# Patient Record
Sex: Male | Born: 1960 | Race: Asian | Hispanic: No | Marital: Married | Smoking: Never smoker
Health system: Southern US, Community
[De-identification: ages and names within clinical notes are randomized; demographics above are authoritative.]

## PROBLEM LIST (undated history)

## (undated) DIAGNOSIS — E785 Hyperlipidemia, unspecified: Secondary | ICD-10-CM

## (undated) DIAGNOSIS — K279 Peptic ulcer, site unspecified, unspecified as acute or chronic, without hemorrhage or perforation: Secondary | ICD-10-CM

## (undated) DIAGNOSIS — K219 Gastro-esophageal reflux disease without esophagitis: Secondary | ICD-10-CM

## (undated) HISTORY — DX: Hyperlipidemia, unspecified: E78.5

## (undated) HISTORY — DX: Peptic ulcer, site unspecified, unspecified as acute or chronic, without hemorrhage or perforation: K27.9

## (undated) HISTORY — PX: ESOPHAGOGASTRODUODENOSCOPY: SHX1529

## (undated) HISTORY — DX: Gastro-esophageal reflux disease without esophagitis: K21.9

---

## 2010-03-24 HISTORY — PX: HEMORRHOID SURGERY: SHX153

## 2010-08-31 ENCOUNTER — Observation Stay (HOSPITAL_COMMUNITY): Admission: AD | Admit: 2010-08-31 | Discharge: 2010-04-05 | Payer: Self-pay | Admitting: General Surgery

## 2010-12-10 LAB — BASIC METABOLIC PANEL
BUN: 16 mg/dL (ref 6–23)
Chloride: 106 mEq/L (ref 96–112)
Creatinine, Ser: 0.76 mg/dL (ref 0.4–1.5)
GFR calc Af Amer: >60 mL/min (ref >60)
Glucose, Bld: 95 mg/dL (ref 70–99)
Potassium: 4 mEq/L (ref 3.5–5.1)
Sodium: 141 mEq/L (ref 135–145)

## 2010-12-10 LAB — CBC
MCH: 29.5 pg (ref 26.0–34.0)
MCHC: 34 g/dL (ref 30.0–36.0)
RBC: 5.05 MIL/uL (ref 4.22–5.81)

## 2012-01-28 IMAGING — CR DG CHEST 2V
2 series · 2 of 2 positions shown · non-contrast
Comparison: None.

CLINICAL DATA: 49-year-old male preoperative study.

CHEST - 2 VIEW

[w chest pa]
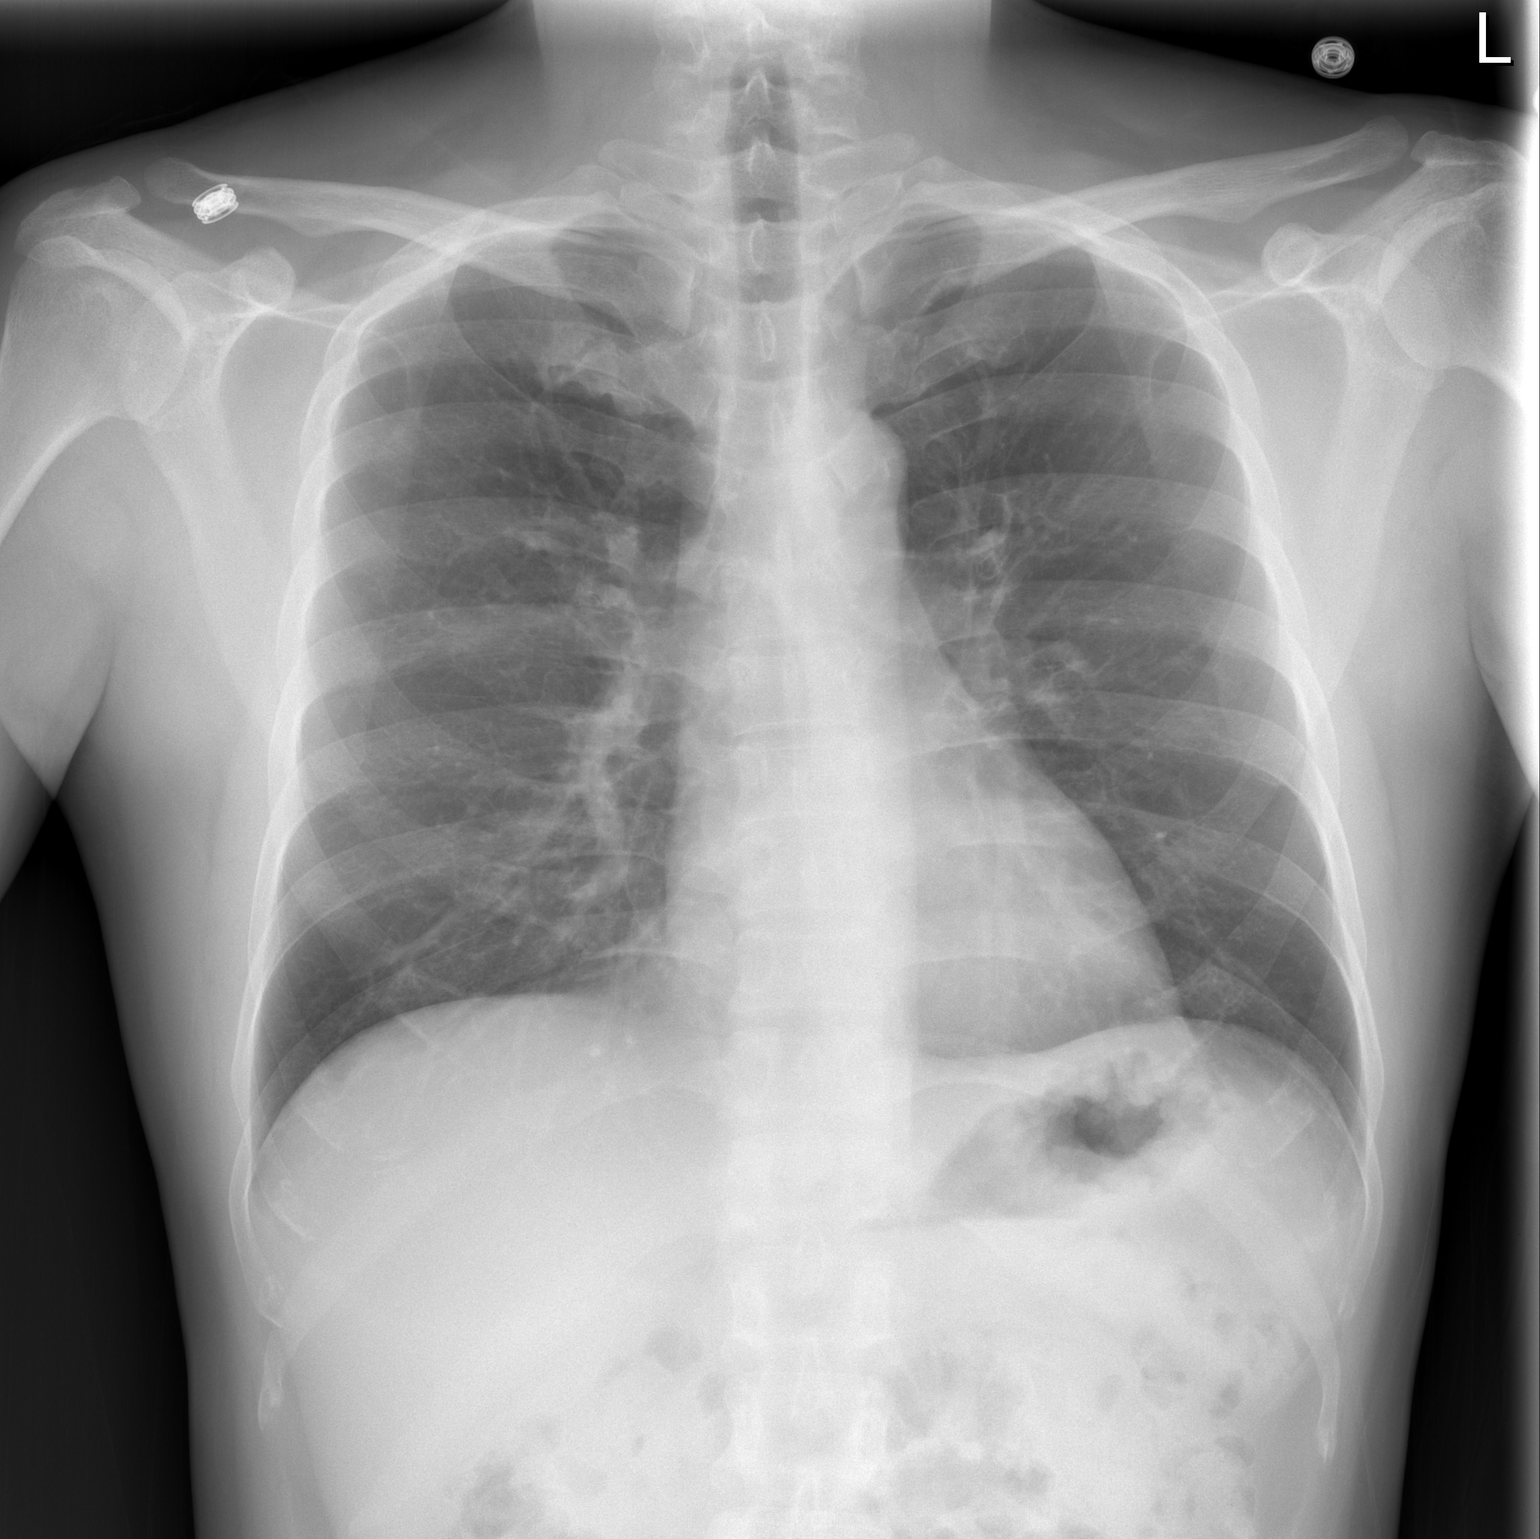

[w chest lat]
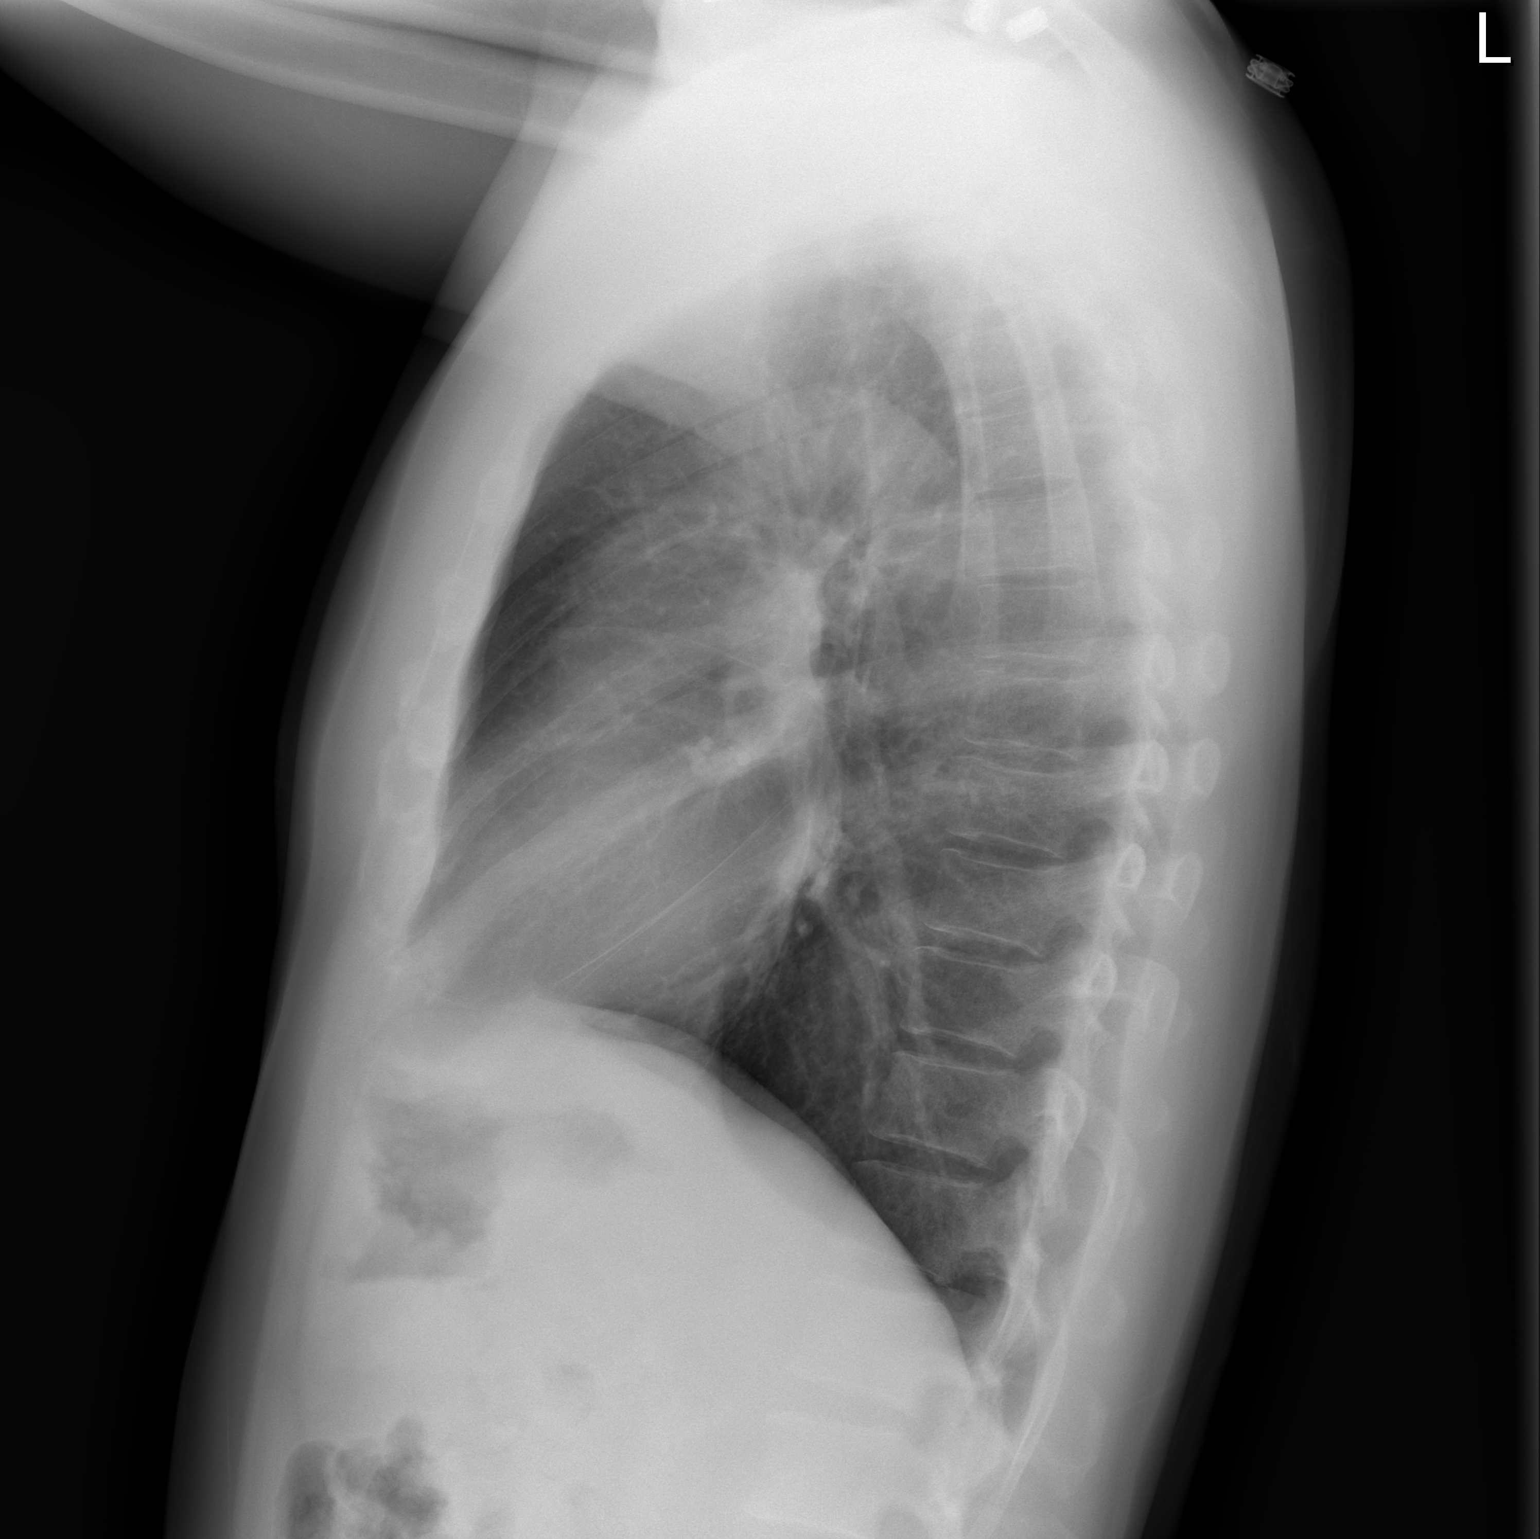

[2 of 2 positions shown; findings below may reference images not displayed]

FINDINGS: Normal lung volumes. Normal cardiac size and mediastinal
contours.  Visualized tracheal air column is within normal limits.
The lungs are clear. No acute osseous abnormality identified.
IMPRESSION: Negative, no acute cardiopulmonary abnormality.

## 2015-09-05 ENCOUNTER — Encounter: Payer: Self-pay | Admitting: Family Medicine

## 2015-09-05 ENCOUNTER — Ambulatory Visit (INDEPENDENT_AMBULATORY_CARE_PROVIDER_SITE_OTHER): Payer: BC Managed Care – PPO | Admitting: Family Medicine

## 2015-09-05 VITALS — BP 128/74 | Temp 97.8°F | Ht 68.25 in | Wt 149.0 lb

## 2015-09-05 DIAGNOSIS — Z0000 Encounter for general adult medical examination without abnormal findings: Secondary | ICD-10-CM

## 2015-09-05 DIAGNOSIS — Z125 Encounter for screening for malignant neoplasm of prostate: Secondary | ICD-10-CM

## 2015-09-05 NOTE — Progress Notes (Signed)
Office Note 09/05/2015  CC: No chief complaint on file.   HPI:  Johnny Fields is a 54 y.o. Asian male who is here to establish care.   Patient's most recent primary MD: Aleatha Borer, [facility] Old records in EPIC/HL EMR were reviewed prior to or during today's visit. Mild language barrier today.  Gets regular dental care.  No acute complaints today. Sounds like hx of hyperlipidemia but no meds recommended?--pt not clear, no old records.  Past Medical History  Diagnosis Date  . GERD (gastroesophageal reflux disease)   . PUD (peptic ulcer disease)   . Hyperlipidemia     Past Surgical History  Procedure Laterality Date  . Hemorrhoid surgery  03/2010  . Esophagogastroduodenoscopy  2000/2001    PUD/upper GI bleed.  Sounds like he was treated for H pylori at least once    No family history on file.  Social History   Social History  . Marital Status: Married    Spouse Name: N/A  . Number of Children: N/A  . Years of Education: N/A   Occupational History  . Not on file.   Social History Main Topics  . Smoking status: Never Smoker   . Smokeless tobacco: Not on file  . Alcohol Use: Not on file  . Drug Use: Not on file  . Sexual Activity: Not on file   Other Topics Concern  . Not on file   Social History Narrative   Married, one son and one daughter.   Orig from [place], has been in [place] almost 20 yrs.   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   No tobacco.  Occ alcohol.  No drugs.    Outpatient Encounter Prescriptions as of 09/05/2015  Medication Sig  . omeprazole (PRILOSEC) 20 MG capsule Take 20 mg by mouth.   No facility-administered encounter medications on file as of 09/05/2015.    No Known Allergies  ROS Review of Systems  Constitutional: Negative for fever, chills, appetite change and fatigue.  HENT: Negative for congestion, dental problem, ear pain and sore throat.   Eyes: Negative for discharge, redness  and visual disturbance.  Respiratory: Negative for cough, chest tightness, shortness of breath and wheezing.   Cardiovascular: Negative for chest pain, palpitations and leg swelling.  Gastrointestinal: Negative for nausea, vomiting, abdominal pain, diarrhea and blood in stool.  Genitourinary: Negative for dysuria, urgency, frequency, hematuria, flank pain and difficulty urinating.  Musculoskeletal: Negative for myalgias, back pain, joint swelling, arthralgias and neck stiffness.  Skin: Negative for pallor and rash.  Neurological: Negative for dizziness, speech difficulty, weakness and headaches.  Hematological: Negative for adenopathy. Does not bruise/bleed easily.  Psychiatric/Behavioral: Negative for confusion and sleep disturbance. The patient is not nervous/anxious.     PE; Blood pressure 128/74, temperature 97.8 F (36.6 C), temperature source Oral, height 5' 8.25" (1.734 m), weight 149 lb (67.586 kg). Gen: Alert, well appearing.  Patient is oriented to person, place, time, and situation. AFFECT: pleasant, lucid thought and speech. ENT: Ears: EACs clear, normal epithelium.  TMs with good light reflex and landmarks bilaterally.  Eyes: no injection, icteris, swelling, or exudate.  EOMI, PERRLA. Nose: no drainage or turbinate edema/swelling.  No injection or focal lesion.  Mouth: lips without lesion/swelling.  Oral mucosa pink and moist.  Dentition intact and without obvious caries or gingival swelling.  Oropharynx without erythema, exudate, or swelling.  Neck: supple/nontender.  No LAD, mass, or TM.  Carotid pulses 2+ bilaterally, without  bruits. CV: RRR, no m/r/g.   LUNGS: CTA bilat, nonlabored resps, good aeration in all lung fields. ABD: soft, NT, ND, BS normal.  No hepatospenomegaly or mass.  No bruits. EXT: no clubbing, cyanosis, or edema.  Musculoskeletal: no joint swelling, erythema, warmth, or tenderness.  ROM of all joints intact. Skin - no sores or suspicious lesions or rashes  or color changes Rectal exam: negative without mass, lesions or tenderness, PROSTATE EXAM: smooth and symmetric without nodules or tenderness.   Pertinent labs:  None today  ASSESSMENT AND PLAN:   New pt; obtain prior PCP records.  Health maintenance exam: Reviewed age and gender appropriate health maintenance issues (prudent diet, regular exercise, health risks of tobacco and excessive alcohol, use of seatbelts, fire alarms in home, use of sunscreen).  Also reviewed age and gender appropriate health screening as well as vaccine recommendations. Pt declined flu vaccine and colon cancer screening today. DRE normal.  PSA ordered. HP ordered and he'll return when fasting to get this.  An After Visit Summary was printed and given to the patient.   Return in about 1 year (around 09/04/2016) for annual CPE (fasting); also needs fasting lab appt at his earliest convenience.

## 2015-09-06 ENCOUNTER — Other Ambulatory Visit (INDEPENDENT_AMBULATORY_CARE_PROVIDER_SITE_OTHER): Payer: BC Managed Care – PPO

## 2015-09-06 DIAGNOSIS — Z0000 Encounter for general adult medical examination without abnormal findings: Secondary | ICD-10-CM | POA: Diagnosis not present

## 2015-09-06 DIAGNOSIS — R7989 Other specified abnormal findings of blood chemistry: Secondary | ICD-10-CM | POA: Diagnosis not present

## 2015-09-06 DIAGNOSIS — Z125 Encounter for screening for malignant neoplasm of prostate: Secondary | ICD-10-CM | POA: Diagnosis not present

## 2015-09-06 LAB — CBC WITH DIFFERENTIAL/PLATELET
BASOS ABS: 0 10*3/uL (ref 0.0–0.1)
BASOS PCT: 0.7 % (ref 0.0–3.0)
EOS ABS: 0.2 10*3/uL (ref 0.0–0.7)
EOS PCT: 4.4 % (ref 0.0–5.0)
HCT: 45.8 % (ref 39.0–52.0)
HEMOGLOBIN: 15 g/dL (ref 13.0–17.0)
LYMPHS PCT: 33.4 % (ref 12.0–46.0)
Lymphs Abs: 1.2 10*3/uL (ref 0.7–4.0)
MCHC: 32.8 g/dL (ref 30.0–36.0)
MCV: 88.2 fl (ref 78.0–100.0)
MONO ABS: 0.2 10*3/uL (ref 0.1–1.0)
Monocytes Relative: 5.3 % (ref 3.0–12.0)
NEUTROS ABS: 2 10*3/uL (ref 1.4–7.7)
Neutrophils Relative %: 56.2 % (ref 43.0–77.0)
Platelets: 241 10*3/uL (ref 150.0–400.0)
RBC: 5.19 Mil/uL (ref 4.22–5.81)
RDW: 13.1 % (ref 11.5–15.5)
WBC: 3.6 10*3/uL — AB (ref 4.0–10.5)

## 2015-09-06 LAB — LIPID PANEL
CHOLESTEROL: 302 mg/dL — AB (ref 0–200)
HDL: 50.7 mg/dL (ref >39.00)
NonHDL: 251.2
TRIGLYCERIDES: 216 mg/dL — AB (ref 0.0–149.0)
Total CHOL/HDL Ratio: 6
VLDL: 43.2 mg/dL — AB (ref 0.0–40.0)

## 2015-09-06 LAB — COMPREHENSIVE METABOLIC PANEL
ALBUMIN: 4.2 g/dL (ref 3.5–5.2)
ALT: 16 U/L (ref 0–53)
AST: 14 U/L (ref 0–37)
Alkaline Phosphatase: 39 U/L (ref 39–117)
BUN: 15 mg/dL (ref 6–23)
CALCIUM: 9.3 mg/dL (ref 8.4–10.5)
CHLORIDE: 102 meq/L (ref 96–112)
CO2: 31 mEq/L (ref 19–32)
CREATININE: 0.89 mg/dL (ref 0.40–1.50)
GFR: 94.49 mL/min (ref >60.00)
Glucose, Bld: 95 mg/dL (ref 70–99)
Potassium: 4.1 mEq/L (ref 3.5–5.1)
Sodium: 140 mEq/L (ref 135–145)
Total Bilirubin: 0.6 mg/dL (ref 0.2–1.2)
Total Protein: 6.6 g/dL (ref 6.0–8.3)

## 2015-09-06 LAB — PSA: PSA: 2.19 ng/mL (ref 0.10–4.00)

## 2015-09-06 LAB — LDL CHOLESTEROL, DIRECT: LDL DIRECT: 223 mg/dL

## 2015-09-06 LAB — TSH: TSH: 1.16 u[IU]/mL (ref 0.35–4.50)

## 2015-09-07 ENCOUNTER — Encounter: Payer: Self-pay | Admitting: *Deleted

## 2015-10-17 ENCOUNTER — Encounter: Payer: Self-pay | Admitting: Family Medicine

## 2015-10-17 DIAGNOSIS — Z1211 Encounter for screening for malignant neoplasm of colon: Secondary | ICD-10-CM | POA: Insufficient documentation

## 2016-05-24 ENCOUNTER — Ambulatory Visit (INDEPENDENT_AMBULATORY_CARE_PROVIDER_SITE_OTHER): Payer: BC Managed Care – PPO | Admitting: Family Medicine

## 2016-05-24 ENCOUNTER — Encounter: Payer: Self-pay | Admitting: Family Medicine

## 2016-05-24 VITALS — BP 135/82 | HR 69 | Temp 98.6°F | Resp 16 | Ht 68.0 in | Wt 156.0 lb

## 2016-05-24 DIAGNOSIS — K219 Gastro-esophageal reflux disease without esophagitis: Secondary | ICD-10-CM | POA: Insufficient documentation

## 2016-05-24 DIAGNOSIS — B029 Zoster without complications: Secondary | ICD-10-CM

## 2016-05-24 MED ORDER — VALACYCLOVIR HCL 1 G PO TABS: 1000.0000 mg | ORAL_TABLET | Freq: Three times a day (TID) | ORAL | 0 refills | Status: DC

## 2016-05-24 MED ORDER — PREDNISONE 20 MG PO TABS: ORAL_TABLET | ORAL | 0 refills | Status: DC

## 2016-05-24 NOTE — Progress Notes (Signed)
OFFICE VISIT  05/24/2016   CC:  Chief Complaint  Patient presents with  . Rash    left side body   HPI:    Patient is a 55 y.o.  male who presents for rash. Onset about 5 days ago mildly itchy/prickly rash on left side of trunk.  Spreading gradually, little blisters-pink. No fever or malaise.  Has been applying hydrocortisone: no help.   Past Medical History:  Diagnosis Date  . GERD (gastroesophageal reflux disease)   . Hyperlipidemia   . PUD (peptic ulcer disease)     Past Surgical History:  Procedure Laterality Date  . ESOPHAGOGASTRODUODENOSCOPY  2000/2001   PUD/upper GI bleed.  Sounds like he was treated for H pylori at least once  . HEMORRHOID SURGERY  03/2010    Outpatient Medications Prior to Visit  Medication Sig Dispense Refill  . omeprazole (PRILOSEC) 20 MG capsule Take 20 mg by mouth.     No facility-administered medications prior to visit.     No Known Allergies  ROS As per HPI  PE: Blood pressure 135/82, pulse 69, temperature 98.6 F (37 C), temperature source Oral, resp. rate 16, height 5\' 8"  (1.727 m), weight 156 lb (70.8 kg), SpO2 97 %. Gen: Alert, well appearing.  Patient is oriented to person, place, time, and situation. Left side of trunk at the level of lower costal margin he has vesicular rash on pinkish background spread in a dermatomal distribution.  LABS:  none  IMPRESSION AND PLAN:  Herpes Zoster: discussed dx with pt. Valtrex 1g tid x 7d. Prednisone 40mg  qd x 5d, then 20mg  qd x 5d. He is not in much pain at all so nothing was rx'd for this today. Recommended avoiding topical meds. Discussed care of rash as it progresses.  An After Visit Summary was printed and given to the patient.  FOLLOW UP: Return if symptoms worsen or fail to improve.  Signed:  Santiago BumpersPhil McGowen, MD           05/24/2016

## 2016-05-24 NOTE — Progress Notes (Signed)
Pre visit review using our clinic review tool, if applicable. No additional management support is needed unless otherwise documented below in the visit note. 

## 2016-08-01 ENCOUNTER — Ambulatory Visit (INDEPENDENT_AMBULATORY_CARE_PROVIDER_SITE_OTHER): Payer: BC Managed Care – PPO | Admitting: Family Medicine

## 2016-08-01 ENCOUNTER — Encounter: Payer: Self-pay | Admitting: Family Medicine

## 2016-08-01 VITALS — BP 126/72 | HR 56 | Temp 97.5°F | Resp 16 | Wt 155.0 lb

## 2016-08-01 DIAGNOSIS — B354 Tinea corporis: Secondary | ICD-10-CM | POA: Diagnosis not present

## 2016-08-01 NOTE — Patient Instructions (Signed)
Buy otc generic lamisil cream: apply twice a day to the affected areas. It may take a month or more for this to completely go away.

## 2016-08-01 NOTE — Progress Notes (Signed)
OFFICE VISIT  08/01/2016   CC:  Chief Complaint  Patient presents with  . Rash   HPI:    Patient is a 55 y.o. Asian male who presents for rash. Left lower abd wall with two oval, mildly hyperpigmented macules with slightly flaky surface.  Each is about 2-3 cm in diameter.  No erythema, vesicles, pustules, or tenderness. He has been applying an otc ointment he got from a family member "for itch".  He doesn't have any other info about the ointment.  Past Medical History:  Diagnosis Date  . GERD (gastroesophageal reflux disease)   . Hyperlipidemia   . PUD (peptic ulcer disease)     Past Surgical History:  Procedure Laterality Date  . ESOPHAGOGASTRODUODENOSCOPY  2000/2001   PUD/upper GI bleed.  Sounds like he was treated for H pylori at least once  . HEMORRHOID SURGERY  03/2010    Outpatient Medications Prior to Visit  Medication Sig Dispense Refill  . omeprazole (PRILOSEC) 20 MG capsule Take 20 mg by mouth.    . cetirizine (ZYRTEC) 10 MG tablet Take 10 mg by mouth as needed.    . valACYclovir (VALTREX) 1000 MG tablet Take 1 tablet (1,000 mg total) by mouth 3 (three) times daily. (Patient not taking: Reported on 08/01/2016) 21 tablet 0  . predniSONE (DELTASONE) 20 MG tablet 2 tabs po qd x 5d, then 1 tab po qd x 5d 15 tablet 0   No facility-administered medications prior to visit.     No Known Allergies  ROS As per HPI  PE: Blood pressure 126/72, pulse (!) 56, temperature 97.5 F (36.4 C), temperature source Temporal, resp. rate 16, weight 155 lb (70.3 kg), SpO2 99 %. Gen: Alert, well appearing.  Patient is oriented to person, place, time, and situation. AFFECT: pleasant, lucid thought and speech. SKIN: left lower abdomen with  two oval, mildly hyperpigmented macules with slightly flaky surface.  Each is about 2-3 cm in diameter.  No erythema, vesicles, pustules, or tenderness.  LABS:  none  IMPRESSION AND PLAN:  Tinea corporis;  Instructions: Buy otc generic lamisil  cream: apply twice a day to the affected areas. It may take a month or more for this to completely go away.    An After Visit Summary was printed and given to the patient.  FOLLOW UP: Return if symptoms worsen or fail to improve.  Signed:  Santiago BumpersPhil Tishana Clinkenbeard, MD           08/01/2016

## 2016-08-01 NOTE — Progress Notes (Signed)
Pre visit review using our clinic review tool, if applicable. No additional management support is needed unless otherwise documented below in the visit note. 

## 2016-08-24 ENCOUNTER — Encounter: Payer: BC Managed Care – PPO | Admitting: Family Medicine

## 2016-08-24 ENCOUNTER — Other Ambulatory Visit: Payer: Self-pay | Admitting: *Deleted

## 2016-08-24 ENCOUNTER — Other Ambulatory Visit (INDEPENDENT_AMBULATORY_CARE_PROVIDER_SITE_OTHER): Payer: BC Managed Care – PPO

## 2016-08-24 DIAGNOSIS — Z125 Encounter for screening for malignant neoplasm of prostate: Secondary | ICD-10-CM | POA: Diagnosis not present

## 2016-08-24 DIAGNOSIS — Z0000 Encounter for general adult medical examination without abnormal findings: Secondary | ICD-10-CM

## 2016-08-24 LAB — CBC WITH DIFFERENTIAL/PLATELET
BASOS ABS: 0 10*3/uL (ref 0.0–0.1)
BASOS PCT: 0.6 % (ref 0.0–3.0)
Eosinophils Absolute: 0.2 10*3/uL (ref 0.0–0.7)
Eosinophils Relative: 5.4 % — ABNORMAL HIGH (ref 0.0–5.0)
HCT: 45.2 % (ref 39.0–52.0)
Hemoglobin: 15.1 g/dL (ref 13.0–17.0)
LYMPHS ABS: 1.3 10*3/uL (ref 0.7–4.0)
Lymphocytes Relative: 33.5 % (ref 12.0–46.0)
MCHC: 33.5 g/dL (ref 30.0–36.0)
MCV: 86.8 fl (ref 78.0–100.0)
MONOS PCT: 7.2 % (ref 3.0–12.0)
Monocytes Absolute: 0.3 10*3/uL (ref 0.1–1.0)
NEUTROS ABS: 2.1 10*3/uL (ref 1.4–7.7)
NEUTROS PCT: 53.3 % (ref 43.0–77.0)
PLATELETS: 255 10*3/uL (ref 150.0–400.0)
RBC: 5.2 Mil/uL (ref 4.22–5.81)
RDW: 13.1 % (ref 11.5–15.5)
WBC: 3.9 10*3/uL — ABNORMAL LOW (ref 4.0–10.5)

## 2016-08-24 LAB — LIPID PANEL
CHOLESTEROL: 353 mg/dL — AB (ref 0–200)
HDL: 53.8 mg/dL (ref >39.00)
LDL CALC: 260 mg/dL — AB (ref 0–99)
NonHDL: 299.39
TRIGLYCERIDES: 198 mg/dL — AB (ref 0.0–149.0)
Total CHOL/HDL Ratio: 7
VLDL: 39.6 mg/dL (ref 0.0–40.0)

## 2016-08-24 LAB — COMPREHENSIVE METABOLIC PANEL
ALT: 18 U/L (ref 0–53)
AST: 14 U/L (ref 0–37)
Albumin: 4.3 g/dL (ref 3.5–5.2)
Alkaline Phosphatase: 37 U/L — ABNORMAL LOW (ref 39–117)
BUN: 17 mg/dL (ref 6–23)
CALCIUM: 9.3 mg/dL (ref 8.4–10.5)
CHLORIDE: 103 meq/L (ref 96–112)
CO2: 30 meq/L (ref 19–32)
Creatinine, Ser: 0.8 mg/dL (ref 0.40–1.50)
GFR: 106.47 mL/min (ref >60.00)
GLUCOSE: 92 mg/dL (ref 70–99)
POTASSIUM: 4.1 meq/L (ref 3.5–5.1)
Sodium: 140 mEq/L (ref 135–145)
Total Bilirubin: 0.5 mg/dL (ref 0.2–1.2)
Total Protein: 7 g/dL (ref 6.0–8.3)

## 2016-08-24 LAB — TSH: TSH: 1.03 u[IU]/mL (ref 0.35–4.50)

## 2016-08-24 LAB — PSA: PSA: 0.9 ng/mL (ref 0.10–4.00)

## 2016-09-10 ENCOUNTER — Encounter: Payer: Self-pay | Admitting: Family Medicine

## 2016-09-10 ENCOUNTER — Ambulatory Visit (INDEPENDENT_AMBULATORY_CARE_PROVIDER_SITE_OTHER): Payer: BC Managed Care – PPO | Admitting: Family Medicine

## 2016-09-10 VITALS — BP 126/78 | HR 66 | Temp 98.0°F | Resp 16 | Ht 68.0 in | Wt 154.8 lb

## 2016-09-10 DIAGNOSIS — E7800 Pure hypercholesterolemia, unspecified: Secondary | ICD-10-CM | POA: Diagnosis not present

## 2016-09-10 DIAGNOSIS — Z0000 Encounter for general adult medical examination without abnormal findings: Secondary | ICD-10-CM | POA: Diagnosis not present

## 2016-09-10 MED ORDER — ATORVASTATIN CALCIUM 40 MG PO TABS: 40.0000 mg | ORAL_TABLET | Freq: Every day | ORAL | 3 refills | Status: DC

## 2016-09-10 NOTE — Progress Notes (Signed)
Office Note 09/10/2016  CC:  Chief Complaint  Patient presents with  . Annual Exam    Labs done 08/24/16   HPI:  Johnny Fields is a 55 y.o. Asian male who is here for annual health maintenance exam. We reviewed recent fasting health panel labs in detail at today's visit.  Once again, his cholesterol was elevated and I recommended a statin, as I did last year. His only exercise is push mowing his lawn. Last eye exam was about 2 yrs ago.       Past Medical History:  Diagnosis Date  . GERD (gastroesophageal reflux disease)   . Hyperlipidemia   . PUD (peptic ulcer disease)     Past Surgical History:  Procedure Laterality Date  . ESOPHAGOGASTRODUODENOSCOPY  2000/2001   PUD/upper GI bleed.  Sounds like he was treated for H pylori at least once  . HEMORRHOID SURGERY  03/2010    History reviewed. No pertinent family history.  Social History   Social History  . Marital status: Married    Spouse name: N/A  . Number of children: N/A  . Years of education: N/A   Occupational History  . Not on file.   Social History Main Topics  . Smoking status: Never Smoker  . Smokeless tobacco: Never Used  . Alcohol use Not on file  . Drug use: Unknown  . Sexual activity: Not on file   Other Topics Concern  . Not on file   Social History Narrative   Married, one son and one daughter.   Orig from [place], has been in [place] almost 20 yrs.   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   No tobacco.  Occ alcohol.  No drugs.   MEDS: none currently  No Known Allergies  ROS Review of Systems  Constitutional: Negative for appetite change, chills, fatigue and fever.  HENT: Negative for congestion, dental problem, ear pain and sore throat.   Eyes: Negative for discharge, redness and visual disturbance.  Respiratory: Negative for cough, chest tightness, shortness of breath and wheezing.   Cardiovascular: Negative for chest pain, palpitations and leg swelling.   Gastrointestinal: Negative for abdominal pain, blood in stool, diarrhea, nausea and vomiting.  Genitourinary: Negative for difficulty urinating, dysuria, flank pain, frequency, hematuria and urgency.  Musculoskeletal: Negative for arthralgias, back pain, joint swelling, myalgias and neck stiffness.  Skin: Negative for pallor and rash.  Neurological: Negative for dizziness, speech difficulty, weakness and headaches.  Hematological: Negative for adenopathy. Does not bruise/bleed easily.  Psychiatric/Behavioral: Negative for confusion and sleep disturbance. The patient is not nervous/anxious.     PE; Blood pressure 126/78, pulse 66, temperature 98 F (36.7 C), temperature source Oral, resp. rate 16, height 5\' 8"  (1.727 m), weight 154 lb 12 oz (70.2 kg), SpO2 97 %. Gen: Alert, well appearing.  Patient is oriented to person, place, time, and situation. AFFECT: pleasant, lucid thought and speech. ENT: Ears: EACs clear, normal epithelium.  TMs with good light reflex and landmarks bilaterally.  Eyes: no injection, icteris, swelling, or exudate.  EOMI, PERRLA. Nose: no drainage or turbinate edema/swelling.  No injection or focal lesion.  Mouth: lips without lesion/swelling.  Oral mucosa pink and moist.  Dentition intact and without obvious caries or gingival swelling.  Oropharynx without erythema, exudate, or swelling.  Neck: supple/nontender.  No LAD, mass, or TM.  Carotid pulses 2+ bilaterally, without bruits. CV: RRR, no m/r/g.   LUNGS: CTA bilat, nonlabored resps, good aeration in  all lung fields. ABD: soft, NT, ND, BS normal.  No hepatospenomegaly or mass.  No bruits. EXT: no clubbing, cyanosis, or edema.  Musculoskeletal: no joint swelling, erythema, warmth, or tenderness.  ROM of all joints intact. Skin - no sores or suspicious lesions or rashes or color changes Rectal exam: negative without mass, lesions or tenderness, PROSTATE EXAM: smooth and symmetric without nodules or  tenderness.  Pertinent labs:  Lab Results  Component Value Date   TSH 1.03 08/24/2016   Lab Results  Component Value Date   WBC 3.9 (L) 08/24/2016   HGB 15.1 08/24/2016   HCT 45.2 08/24/2016   MCV 86.8 08/24/2016   PLT 255.0 08/24/2016   Lab Results  Component Value Date   CREATININE 0.80 08/24/2016   BUN 17 08/24/2016   NA 140 08/24/2016   K 4.1 08/24/2016   CL 103 08/24/2016   CO2 30 08/24/2016   Lab Results  Component Value Date   ALT 18 08/24/2016   AST 14 08/24/2016   ALKPHOS 37 (L) 08/24/2016   BILITOT 0.5 08/24/2016   Lab Results  Component Value Date   CHOL 353 (H) 08/24/2016   Lab Results  Component Value Date   HDL 53.80 08/24/2016   Lab Results  Component Value Date   LDLCALC 260 (H) 08/24/2016   Lab Results  Component Value Date   TRIG 198.0 (H) 08/24/2016   Lab Results  Component Value Date   CHOLHDL 7 08/24/2016   Lab Results  Component Value Date   PSA 0.90 08/24/2016   PSA 2.19 09/06/2015   ASSESSMENT AND PLAN:   Health maintenance exam: Reviewed age and gender appropriate health maintenance issues (prudent diet, regular exercise, health risks of tobacco and excessive alcohol, use of seatbelts, fire alarms in home, use of sunscreen).  Also reviewed age and gender appropriate health screening as well as vaccine recommendations. He declined flu vaccine today. We discussed his health panel labs in detail today: hyperlipidemia the last 2 yrs.  He finally agrees to try a statin today.  I rx'd atorvastatin 40mg , 1 tab po qd, #30, RF x 3.  Return for lab visit in 2-3 mo for FLP. Prostate ca screening: DRE normal, PSA normal. Colon cancer screening: pt chose cologuard today.  He understands that if this returns positive then he will need a colonoscopy.  An After Visit Summary was printed and given to the patient.  FOLLOW UP:  Return in about 1 year (around 09/10/2017) for annual CPE with fasting labs the week prior;  also needs fasting lab  visit in 2-3 months.  Signed:  Santiago BumpersPhil McGowen, MD           09/10/2016

## 2016-09-10 NOTE — Progress Notes (Signed)
Pre visit review using our clinic review tool, if applicable. No additional management support is needed unless otherwise documented below in the visit note. 

## 2016-09-11 ENCOUNTER — Encounter: Payer: BC Managed Care – PPO | Admitting: Family Medicine

## 2016-09-21 LAB — COLOGUARD: COLOGUARD: NEGATIVE

## 2016-09-25 ENCOUNTER — Encounter: Payer: Self-pay | Admitting: Family Medicine

## 2016-09-26 ENCOUNTER — Telehealth: Payer: Self-pay | Admitting: Family Medicine

## 2016-09-26 NOTE — Telephone Encounter (Signed)
Pls notify pt that his stool test for colon cancer screening came back negative.  This is good.  We'll recheck this test in 3 years.-thx

## 2016-09-27 NOTE — Telephone Encounter (Signed)
Left detailed message on home vm, okay per DPR.  

## 2016-12-10 ENCOUNTER — Other Ambulatory Visit (INDEPENDENT_AMBULATORY_CARE_PROVIDER_SITE_OTHER): Payer: BC Managed Care – PPO

## 2016-12-10 ENCOUNTER — Telehealth: Payer: Self-pay | Admitting: Family Medicine

## 2016-12-10 ENCOUNTER — Encounter: Payer: Self-pay | Admitting: Family Medicine

## 2016-12-10 DIAGNOSIS — E7800 Pure hypercholesterolemia, unspecified: Secondary | ICD-10-CM

## 2016-12-10 LAB — LIPID PANEL
CHOL/HDL RATIO: 6
Cholesterol: 339 mg/dL — ABNORMAL HIGH (ref 0–200)
HDL: 58.8 mg/dL (ref >39.00)
LDL CALC: 253 mg/dL — AB (ref 0–99)
NONHDL: 280.09
TRIGLYCERIDES: 134 mg/dL (ref 0.0–149.0)
VLDL: 26.8 mg/dL (ref 0.0–40.0)

## 2016-12-10 NOTE — Telephone Encounter (Signed)
Patient advised about other alternatives.

## 2016-12-10 NOTE — Telephone Encounter (Signed)
LM with pts wife to have pt call back.   Please advise pt that do have other alternatives for colon cancer screening. Will recheck colon cancer screening in 3 years.

## 2016-12-10 NOTE — Telephone Encounter (Signed)
Patient states that [facility] is out of network for his [employer] policy and has received a large bill for his test.  Is there an alternative for him next time?  I wasn't sure who to route this to.

## 2016-12-12 ENCOUNTER — Encounter: Payer: Self-pay | Admitting: Family Medicine

## 2017-06-07 ENCOUNTER — Encounter: Payer: Self-pay | Admitting: Family Medicine

## 2017-06-07 ENCOUNTER — Ambulatory Visit (INDEPENDENT_AMBULATORY_CARE_PROVIDER_SITE_OTHER): Payer: BC Managed Care – PPO | Admitting: Family Medicine

## 2017-06-07 VITALS — BP 114/68 | HR 60 | Temp 98.2°F | Resp 16 | Ht 68.0 in | Wt 148.8 lb

## 2017-06-07 DIAGNOSIS — M791 Myalgia, unspecified site: Secondary | ICD-10-CM

## 2017-06-07 DIAGNOSIS — E860 Dehydration: Secondary | ICD-10-CM | POA: Diagnosis not present

## 2017-06-07 DIAGNOSIS — E7800 Pure hypercholesterolemia, unspecified: Secondary | ICD-10-CM | POA: Diagnosis not present

## 2017-06-07 DIAGNOSIS — R064 Hyperventilation: Secondary | ICD-10-CM | POA: Diagnosis not present

## 2017-06-07 LAB — LIPID PANEL
CHOL/HDL RATIO: 5.8 (calc) — AB (ref <5.0)
CHOLESTEROL: 356 mg/dL — AB (ref <200)
HDL: 61 mg/dL (ref >40)
LDL CHOLESTEROL (CALC): 267 mg/dL — AB
Non-HDL Cholesterol (Calc): 295 mg/dL (calc) — ABNORMAL HIGH (ref <130)
TRIGLYCERIDES: 133 mg/dL (ref <150)

## 2017-06-07 NOTE — Progress Notes (Signed)
OFFICE VISIT  06/07/2017   CC:  Chief Complaint  Patient presents with  . Muscle Cramp in back   HPI:    Patient is a 56 y.o. male who presents for back pain. Was cutting grass with push mower yesterday for 4 hours.  At the end he felt tightness in between the shoulder blades.  Felt some dizziness, chest tightness, fingers and toes tingling, some nausea.  He did his work on Manufacturing systems engineer.  His clothes were soaking wet with sweat and he said he weighed 5 lb less! Currently no back pain.  The pain lasted 4-5 hours. All his other sx's resolved w/in 30 min or so at the most. Feels great today.   Past Medical History:  Diagnosis Date  . GERD (gastroesophageal reflux disease)   . Hyperlipidemia    Rx'd atorva 09/2016 but pt did not take.  He did some TLC but lipids not changed 3 mo later.  He declined statin again as of 12/12/16.  Marland Kitchen PUD (peptic ulcer disease)    Pt reports hx of H pylori x 2.    Past Surgical History:  Procedure Laterality Date  . ESOPHAGOGASTRODUODENOSCOPY  2000/2001   PUD/upper GI bleed.  Sounds like he was treated for H pylori at least once  . HEMORRHOID SURGERY  03/2010    Outpatient Medications Prior to Visit  Medication Sig Dispense Refill  . atorvastatin (LIPITOR) 40 MG tablet Take 1 tablet (40 mg total) by mouth daily. (Patient not taking: Reported on 06/07/2017) 30 tablet 3   No facility-administered medications prior to visit.     No Known Allergies  ROS As per HPI  PE: Blood pressure 114/68, pulse 60, temperature 98.2 F (36.8 C), temperature source Oral, resp. rate 16, height  (1.727 m), weight 148 lb 12 oz (67.5 kg), SpO2 96 %. Gen: Alert, well appearing.  Patient is oriented to person, place, time, and situation. AFFECT: pleasant, lucid thought and speech. RUE:AVWU: no injection, icteris, swelling, or exudate.  EOMI, PERRLA. Mouth: lips without lesion/swelling.  Oral mucosa pink and moist. Oropharynx without erythema, exudate, or swelling.   CV: RRR, no m/r/g.   LUNGS: CTA bilat, nonlabored resps, good aeration in all lung fields. EXT: no clubbing, cyanosis, or edema.  PT pulses 2+ bilat. Radial pulses 2+ bilat. Hands and feet warm and pink. Back and chest wall with no TTP.  LABS:    Chemistry      Component Value Date/Time   NA 140 08/24/2016 0808   K 4.1 08/24/2016 0808   CL 103 08/24/2016 0808   CO2 30 08/24/2016 0808   BUN 17 08/24/2016 0808   CREATININE 0.80 08/24/2016 0808      Component Value Date/Time   CALCIUM 9.3 08/24/2016 0808   ALKPHOS 37 (L) 08/24/2016 0808   AST 14 08/24/2016 0808   ALT 18 08/24/2016 0808   BILITOT 0.5 08/24/2016 0808     Lab Results  Component Value Date   CHOL 339 (H) 12/10/2016   HDL 58.80 12/10/2016   LDLCALC 253 (H) 12/10/2016   LDLDIRECT 223.0 09/06/2015   TRIG 134.0 12/10/2016   CHOLHDL 6 12/10/2016   IMPRESSION AND PLAN:  1) Mid back pain from overexertion/fatigue/dehydration: this triggered anxiety and hyperventilation b/c he is very anxious and afraid that his symptoms are signs of a heart attack. I reassured him today.  Advised him to break his strenuous yard work up into 1-2 hour time blocks, hydrate during work and when resting (and before  and after work).    2) Hypercholesterolemia: it has been 6 mo since he has been trying TLC for hyperlipidemia. We'll recheck FLP today.  An After Visit Summary was printed and given to the patient.  FOLLOW UP: Return for as needed.  Signed:  Santiago Bumpers, MD           06/07/2017

## 2017-06-09 ENCOUNTER — Encounter: Payer: Self-pay | Admitting: Family Medicine

## 2017-06-10 ENCOUNTER — Other Ambulatory Visit: Payer: Self-pay | Admitting: *Deleted

## 2017-06-10 ENCOUNTER — Encounter: Payer: Self-pay | Admitting: *Deleted

## 2017-06-10 DIAGNOSIS — E785 Hyperlipidemia, unspecified: Secondary | ICD-10-CM | POA: Insufficient documentation

## 2017-06-10 MED ORDER — ATORVASTATIN CALCIUM 40 MG PO TABS: 40.0000 mg | ORAL_TABLET | Freq: Every day | ORAL | 2 refills | Status: DC

## 2019-12-03 ENCOUNTER — Ambulatory Visit: Payer: BC Managed Care – PPO | Attending: Family

## 2019-12-03 DIAGNOSIS — Z23 Encounter for immunization: Secondary | ICD-10-CM

## 2019-12-03 NOTE — Progress Notes (Signed)
   Covid-19 Vaccination Clinic  Name:  Johnny Fields    MRN: [id number] DOB: 12-27-1960  12/03/2019  [name] was observed post Covid-19 immunization for 15 minutes without incident. He was provided with Vaccine Information Sheet and instruction to access the V-Safe system.   [name] was instructed to call 911 with any severe reactions post vaccine: Marland Kitchen Difficulty breathing  . Swelling of face and throat  . A fast heartbeat  . A bad rash all over body  . Dizziness and weakness   Immunizations Administered    Name Date Dose VIS Date Route   Moderna COVID-19 Vaccine 12/03/2019 11:23 AM 0.5 mL 08/25/2019 Intramuscular   Manufacturer: Moderna   Lot: [id number]   NDC: 78478-412-82

## 2020-01-05 ENCOUNTER — Ambulatory Visit: Payer: BC Managed Care – PPO | Attending: Family

## 2020-01-05 DIAGNOSIS — Z23 Encounter for immunization: Secondary | ICD-10-CM

## 2020-01-05 NOTE — Progress Notes (Signed)
   Covid-19 Vaccination Clinic  Name:  Maury Groninger    MRN: [id number] DOB: 30-Mar-1961  01/05/2020  Mr. Mijangos was observed post Covid-19 immunization for 15 minutes without incident. He was provided with Vaccine Information Sheet and instruction to access the V-Safe system.   Mr. Sibert was instructed to call 911 with any severe reactions post vaccine: Marland Kitchen Difficulty breathing  . Swelling of face and throat  . A fast heartbeat  . A bad rash all over body  . Dizziness and weakness   Immunizations Administered    Name Date Dose VIS Date Route   Moderna COVID-19 Vaccine 01/05/2020  2:09 PM 0.5 mL 08/25/2019 Intramuscular   Manufacturer: Moderna   Lot: [id number]   NDC: 65681-275-17

## 2021-01-29 ENCOUNTER — Emergency Department (HOSPITAL_BASED_OUTPATIENT_CLINIC_OR_DEPARTMENT_OTHER)
Admission: EM | Admit: 2021-01-29 | Discharge: 2021-01-29 | Disposition: A | Discharge status: Home / Self Care | Payer: BC Managed Care – PPO | Attending: Emergency Medicine | Admitting: Emergency Medicine

## 2021-01-29 ENCOUNTER — Other Ambulatory Visit: Payer: Self-pay

## 2021-01-29 ENCOUNTER — Encounter (HOSPITAL_BASED_OUTPATIENT_CLINIC_OR_DEPARTMENT_OTHER): Payer: Self-pay | Admitting: Emergency Medicine

## 2021-01-29 DIAGNOSIS — Y93G1 Activity, food preparation and clean up: Secondary | ICD-10-CM | POA: Diagnosis not present

## 2021-01-29 DIAGNOSIS — S6991XA Unspecified injury of right wrist, hand and finger(s), initial encounter: Secondary | ICD-10-CM | POA: Diagnosis present

## 2021-01-29 DIAGNOSIS — S61011A Laceration without foreign body of right thumb without damage to nail, initial encounter: Secondary | ICD-10-CM | POA: Diagnosis not present

## 2021-01-29 DIAGNOSIS — W274XXA Contact with kitchen utensil, initial encounter: Secondary | ICD-10-CM | POA: Insufficient documentation

## 2021-01-29 DIAGNOSIS — Z23 Encounter for immunization: Secondary | ICD-10-CM | POA: Insufficient documentation

## 2021-01-29 MED ORDER — LIDOCAINE HCL (PF) 1 % IJ SOLN
5.0000 mL | Freq: Once | INTRAMUSCULAR | Status: AC
  Administered 2021-01-29: 5 mL
  Filled 2021-01-29: qty 5

## 2021-01-29 MED ORDER — TETANUS-DIPHTH-ACELL PERTUSSIS 5-2.5-18.5 LF-MCG/0.5 IM SUSY
0.5000 mL | PREFILLED_SYRINGE | Freq: Once | INTRAMUSCULAR | Status: AC
  Administered 2021-01-29: 0.5 mL via INTRAMUSCULAR
  Filled 2021-01-29: qty 0.5

## 2021-01-29 NOTE — ED Notes (Signed)
Drsg D&I to right thumb, CMS intact

## 2021-01-29 NOTE — ED Triage Notes (Signed)
Pt arrives pov with driver, reports laceration to R thumb while washing dishes. Bleeding controlled.

## 2021-01-29 NOTE — ED Provider Notes (Signed)
[facility] EMERGENCY DEPARTMENT Provider Note   CSN: [id number] Arrival date & time: 01/29/21  1713     History Chief Complaint  Patient presents with  . Laceration    Johnny Fields is a 60 y.o. male.  HPI Patient is a 60 year old right-hand-dominant male who presents to the emergency department due to a laceration that occurred just prior to arrival to the right thumb.  Patient states he was washing dishes, a dish broke, and cut the affected region.  He reports 2 linear lacerations to the dorsal aspect of the right thumb.  Bleeding controlled with direct pressure.  Unsure of the timing of his last Tdap.  No numbness, weakness, or difficulty with range of motion of the thumb.    Past Medical History:  Diagnosis Date  . GERD (gastroesophageal reflux disease)   . Hyperlipidemia    Rx'd atorva 09/2016 but pt did not take.  He did some TLC but lipids not changed 3 mo later.  He declined statin again as of 12/12/16.  Chol unchanged 05/2017 and I recommended statin again.  . PUD (peptic ulcer disease)    Pt reports hx of H pylori x 2.    Patient Active Problem List   Diagnosis Date Noted  . Hyperlipidemia   . GERD (gastroesophageal reflux disease) 05/24/2016  . Colon cancer screening 10/17/2015    Past Surgical History:  Procedure Laterality Date  . ESOPHAGOGASTRODUODENOSCOPY  2000/2001   PUD/upper GI bleed.  Sounds like he was treated for H pylori at least once  . HEMORRHOID SURGERY  03/2010       History reviewed. No pertinent family history.  Social History   Tobacco Use  . Smoking status: Never Smoker  . Smokeless tobacco: Never Used  Substance Use Topics  . Alcohol use: Not Currently    Alcohol/week: 0.0 standard drinks  . Drug use: Never    Home Medications Prior to Admission medications   Medication Sig Start Date End Date Taking? Authorizing Provider  atorvastatin (LIPITOR) 40 MG tablet Take 1 tablet (40 mg total) by mouth daily. 06/10/17   McGowen,  Maryjean Morn, MD    Allergies    Patient has no known allergies.  Review of Systems   Review of Systems  Musculoskeletal: Positive for myalgias.  Skin: Positive for wound. Negative for color change.  Neurological: Negative for weakness and numbness.   Physical Exam Updated Vital Signs BP (!) 150/84 (BP Location: Left Arm)   Pulse 74   Temp 98.4 F (36.9 C) (Oral)   Resp 16   Ht 5\' 6"  (1.676 m)   Wt 65.8 kg   SpO2 100%   BMI 23.40 kg/m   Physical Exam Vitals and nursing note reviewed.  Constitutional:      General: He is not in acute distress.    Appearance: He is well-developed.  HENT:     Head: Normocephalic and atraumatic.     Right Ear: External ear normal.     Left Ear: External ear normal.  Eyes:     General: No scleral icterus.       Right eye: No discharge.        Left eye: No discharge.     Conjunctiva/sclera: Conjunctivae normal.  Neck:     Trachea: No tracheal deviation.  Cardiovascular:     Rate and Rhythm: Normal rate.  Pulmonary:     Effort: Pulmonary effort is normal. No respiratory distress.     Breath sounds: No stridor.  Abdominal:  General: There is no distension.  Musculoskeletal:        General: No swelling or deformity.     Cervical back: Neck supple.  Skin:    General: Skin is warm and dry.     Findings: Laceration present. No rash.     Comments: 3 cm vertical linear laceration noted to the right thumb.  Next to this laceration is an additional 1.5 cm vertical linear laceration.  Appears to be a through and through wound along the surface of the dorsum of the thumb.  Good cap refill.  Distal sensation intact.  Full range of motion of the thumb.  2+ radial pulse.  Neurological:     Mental Status: He is alert.     Cranial Nerves: Cranial nerve deficit: no gross deficits.    ED Results / Procedures / Treatments   Labs (all labs ordered are listed, but only abnormal results are displayed) Labs Reviewed - No data to  display  EKG None  Radiology No results found.  Procedures .Marland KitchenLaceration Repair  Date/Time: 01/29/2021 7:13 PM Performed by: Placido Sou, PA-C Authorized by: Placido Sou, PA-C   Consent:    Consent obtained:  Verbal   Consent given by:  Patient   Risks discussed:  Infection, need for additional repair, pain, poor cosmetic result and poor wound healing   Alternatives discussed:  No treatment and delayed treatment Universal protocol:    Procedure explained and questions answered to patient or proxy's satisfaction: yes     Relevant documents present and verified: yes     Test results available: yes     Imaging studies available: yes     Required blood products, implants, devices, and special equipment available: yes     Site/side marked: yes     Immediately prior to procedure, a time out was called: yes     Patient identity confirmed:  Verbally with patient Anesthesia:    Anesthesia method:  Local infiltration Laceration details:    Location:  Finger   Finger location:  R thumb   Wound length (cm): one 3 cm and one 1.5 cm. Pre-procedure details:    Preparation:  Patient was prepped and draped in usual sterile fashion Treatment:    Area cleansed with:  Shur-Clens and povidone-iodine   Amount of cleaning:  Extensive   Irrigation method:  Pressure wash Skin repair:    Repair method:  Sutures   Suture size:  5-0   Suture material:  Prolene   Suture technique:  Simple interrupted   Number of sutures:  4 Approximation:    Approximation:  Close Post-procedure details:    Dressing:  Non-adherent dressing and antibiotic ointment   Procedure completion:  Tolerated well, no immediate complications     Medications Ordered in ED Medications  lidocaine (PF) (XYLOCAINE) 1 % injection 5 mL (has no administration in time range)  Tdap (BOOSTRIX) injection 0.5 mL (has no administration in time range)    ED Course  I have reviewed the triage vital signs and the nursing  notes.  Pertinent labs & imaging results that were available during my care of the patient were reviewed by me and considered in my medical decision making (see chart for details).    MDM Rules/Calculators/A&P                          Patient is a 60 year old male who presents the emergency department due to 2 lacerations to the dorsal aspect  of the right thumb that occurred while washing dishes.  Patient is neurovascularly intact in the finger.  Wound was cleaned extensively and closed with Prolene sutures.  Patient tolerated the procedure quite well.  Tdap updated in the emergency department.  Discussed wound care in length.  Suture removal in 7 to 10 days.  Discussed return precautions.  Feel that he is stable for discharge and he is agreeable.  His questions were answered and he was amicable at the time of discharge.  Final Clinical Impression(s) / ED Diagnoses Final diagnoses:  Laceration of right thumb without damage to nail, foreign body presence unspecified, initial encounter    Rx / DC Orders ED Discharge Orders    None       Placido Sou, PA-C 01/29/21 1918    Charlynne Pander, MD 01/29/21 2308

## 2021-01-29 NOTE — Discharge Instructions (Signed)
Please make sure you have the stitches removed in 7 to 10 days.  If you develop any signs or symptoms of infection such as swelling, redness, fevers, nausea, vomiting, please return to the emergency department to be reevaluated.  It was a pleasure to meet you both.

## 2021-01-29 NOTE — ED Notes (Signed)
ED Provider at bedside for Lac Repair

## 2021-02-01 ENCOUNTER — Telehealth: Payer: Self-pay

## 2021-02-01 NOTE — Telephone Encounter (Signed)
Pt came into office asking to be seen for stitch removal from R thumb from ER visit on 01/29/21.  Pt was advised his LDOS with PCP was in 05/2017 and a message would have to be sent to PCP to see if pt could be seen before scheduling.  Pt stated stitches needed to be removed 7-10 days from DOI.  Pt can be reached at [phone] or at [phone].

## 2021-02-01 NOTE — Telephone Encounter (Signed)
Ok to put on my schedule next week for re-establish care and suture removal.

## 2021-02-01 NOTE — Telephone Encounter (Signed)
Please Advise

## 2021-02-02 NOTE — Telephone Encounter (Signed)
Spoke with pt regarding appt, agreed to schedule. Appt set for 5/19, he will make a separate appt for CPE

## 2021-02-09 ENCOUNTER — Encounter: Payer: Self-pay | Admitting: Family Medicine

## 2021-02-09 ENCOUNTER — Other Ambulatory Visit: Payer: Self-pay

## 2021-02-09 ENCOUNTER — Ambulatory Visit (INDEPENDENT_AMBULATORY_CARE_PROVIDER_SITE_OTHER): Payer: BC Managed Care – PPO | Admitting: Family Medicine

## 2021-02-09 VITALS — BP 110/60 | HR 67 | Temp 97.9°F | Resp 16 | Ht 67.0 in | Wt 150.2 lb

## 2021-02-09 DIAGNOSIS — Z4802 Encounter for removal of sutures: Secondary | ICD-10-CM

## 2021-02-09 DIAGNOSIS — Z1159 Encounter for screening for other viral diseases: Secondary | ICD-10-CM | POA: Diagnosis not present

## 2021-02-09 DIAGNOSIS — Z1211 Encounter for screening for malignant neoplasm of colon: Secondary | ICD-10-CM

## 2021-02-09 DIAGNOSIS — E7800 Pure hypercholesterolemia, unspecified: Secondary | ICD-10-CM

## 2021-02-09 DIAGNOSIS — Z0000 Encounter for general adult medical examination without abnormal findings: Secondary | ICD-10-CM | POA: Diagnosis not present

## 2021-02-09 NOTE — Patient Instructions (Signed)

## 2021-02-09 NOTE — Progress Notes (Signed)
Office Note 02/09/2021  CC:  Chief Complaint  Patient presents with  . Re-establish care  . Suture / Staple Removal    Right thumb,stitches placed on 01/29/21    HPI:  Johnny Fields is a 60 y.o. Asian male who is here to re-establish care, get health maintenance exam and for suture removal. I last saw him 06/07/17.  On 01/29/21 he sustained a laceration to right thumb while washing dishes--a plate broke and cut him. Sutures placed in ED and Tdap updated. He feels minimal discomfort in thumb. Fours sutures in place.  Has been doing well, no new chronic or acute medical issues except recent thumb lac.   Past Medical History:  Diagnosis Date  . GERD (gastroesophageal reflux disease)   . Hyperlipidemia    Rx'd atorva 09/2016 but pt did not take.  He did some TLC but lipids not changed 3 mo later.  He declined statin again as of 12/12/16.  Chol unchanged 05/2017 and I recommended statin again.  . PUD (peptic ulcer disease)    Pt reports hx of H pylori x 2.    Past Surgical History:  Procedure Laterality Date  . ESOPHAGOGASTRODUODENOSCOPY  2000/2001   PUD/upper GI bleed.  Sounds like he was treated for H pylori at least once  . HEMORRHOID SURGERY  03/2010    History reviewed. No pertinent family history.  Social History   Socioeconomic History  . Marital status: Married    Spouse name: Not on file  . Number of children: Not on file  . Years of education: Not on file  . Highest education level: Not on file  Occupational History  . Not on file  Tobacco Use  . Smoking status: Never Smoker  . Smokeless tobacco: Never Used  Substance and Sexual Activity  . Alcohol use: Not Currently    Alcohol/week: 0.0 standard drinks  . Drug use: Never  . Sexual activity: Not on file  Other Topics Concern  . Not on file  Social History Narrative   Married, one son and one daughter.   Orig from [place], has been in [place] almost 20 yrs.   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   No tobacco.  Occ alcohol.  No drugs.   Social Determinants of Health   Financial Resource Strain: Not on file  Food Insecurity: Not on file  Transportation Needs: Not on file  Physical Activity: Not on file  Stress: Not on file  Social Connections: Not on file  Intimate Partner Violence: Not on file   MEDS: NONE  No Known Allergies  ROS Review of Systems  Constitutional: Negative for appetite change, chills, fatigue and fever.  HENT: Negative for congestion, dental problem, ear pain and sore throat.   Eyes: Negative for discharge, redness and visual disturbance.  Respiratory: Negative for cough, chest tightness, shortness of breath and wheezing.   Cardiovascular: Negative for chest pain, palpitations and leg swelling.  Gastrointestinal: Negative for abdominal pain, blood in stool, diarrhea, nausea and vomiting.  Genitourinary: Negative for difficulty urinating, dysuria, flank pain, frequency, hematuria and urgency.  Musculoskeletal: Negative for arthralgias, back pain, joint swelling, myalgias and neck stiffness.  Skin: Negative for pallor and rash.  Neurological: Negative for dizziness, speech difficulty, weakness and headaches.  Hematological: Negative for adenopathy. Does not bruise/bleed easily.  Psychiatric/Behavioral: Negative for confusion and sleep disturbance. The patient is not nervous/anxious.     PE; Vitals with BMI 02/09/2021 01/29/2021 01/29/2021  Height  5\' 7"  - -  Weight 150 lbs 3 oz - -  BMI 23.52 - -  Systolic 110 152  Diastolic 60 94 84  Pulse 67 62 74   Gen: Alert, well appearing.  Patient is oriented to person, place, time, and situation. AFFECT: pleasant, lucid thought and speech. ENT: Ears: EACs clear, normal epithelium.  TMs with good light reflex and landmarks bilaterally.  Eyes: no injection, icteris, swelling, or exudate.  EOMI, PERRLA. Nose: no drainage or turbinate edema/swelling.  No injection or focal lesion.  Mouth: lips  without lesion/swelling.  Oral mucosa pink and moist.  Dentition intact and without obvious caries or gingival swelling.  Oropharynx without erythema, exudate, or swelling.  Neck: supple/nontender.  No LAD, mass, or TM.  Carotid pulses 2+ bilaterally, without bruits. CV: RRR, no m/r/g.   LUNGS: CTA bilat, nonlabored resps, good aeration in all lung fields. ABD: soft, NT, ND, BS normal.  No hepatospenomegaly or mass.  No bruits. EXT: no clubbing, cyanosis, or edema.  R thumb dorsal surface with no swelling or erythema.  Superficial wound with edges well approximated and 4 sutures intact.  No tenderness.  ROM intact.  Pink/normal temp. Musculoskeletal: no joint swelling, erythema, warmth, or tenderness.  ROM of all joints intact. Skin - no sores or suspicious lesions or rashes or color changes   Pertinent labs:  Lab Results  Component Value Date   TSH 1.03 08/24/2016   Lab Results  Component Value Date   WBC 3.9 (L) 08/24/2016   HGB 15.1 08/24/2016   HCT 45.2 08/24/2016   MCV 86.8 08/24/2016   PLT 255.0 08/24/2016   Lab Results  Component Value Date   CREATININE 0.80 08/24/2016   BUN 17 08/24/2016   NA 140 08/24/2016   K 4.1 08/24/2016   CL 103 08/24/2016   CO2 30 08/24/2016   Lab Results  Component Value Date   ALT 18 08/24/2016   AST 14 08/24/2016   ALKPHOS 37 (L) 08/24/2016   BILITOT 0.5 08/24/2016   Lab Results  Component Value Date   CHOL 356 (H) 06/07/2017   Lab Results  Component Value Date   HDL 61 06/07/2017   Lab Results  Component Value Date   LDLCALC 267 (H) 06/07/2017   Lab Results  Component Value Date   TRIG 133 06/07/2017   Lab Results  Component Value Date   CHOLHDL 5.8 (H) 06/07/2017   Lab Results  Component Value Date   PSA 0.90 08/24/2016   PSA 2.19 09/06/2015   ASSESSMENT AND PLAN:   1) Hx of Hypercholesterolemia: he declined statins multiple times in the past. Not exercising last couple years, diet is fair. FLP --future when  fasting.  2) R thumb laceration: sutured in ED, 4 sutures (all) removed today w/out problem. Wound looks great. General care instructions given.  3) Health maintenance exam: Reviewed age and gender appropriate health maintenance issues (prudent diet, regular exercise, health risks of tobacco and excessive alcohol, use of seatbelts, fire alarms in home, use of sunscreen).  Also reviewed age and gender appropriate health screening as well as vaccine recommendations. Vaccines: UTD. Labs: return for fasting HP + PSA and Hep C screening. Prostate ca screening: PSA--future. Colon ca screening: He is "average" risk.  Cologuard neg 2017.  Repeat cologuard ordered today.  An After Visit Summary was printed and given to the patient.  FOLLOW UP:  Return in about 1 year (around 02/09/2022) for annual CPE (fasting).  Signed:  02/11/2022,  MD           02/09/2021

## 2021-02-10 ENCOUNTER — Ambulatory Visit: Payer: BC Managed Care – PPO

## 2021-02-10 DIAGNOSIS — E7800 Pure hypercholesterolemia, unspecified: Secondary | ICD-10-CM

## 2021-02-10 DIAGNOSIS — Z1159 Encounter for screening for other viral diseases: Secondary | ICD-10-CM

## 2021-02-10 NOTE — Addendum Note (Signed)
Addended by: Emi Holes D on: 02/10/2021 09:01 AM   Modules accepted: Orders

## 2021-02-13 ENCOUNTER — Telehealth: Payer: Self-pay

## 2021-02-13 LAB — HEPATITIS C ANTIBODY
Hepatitis C Ab: NONREACTIVE
SIGNAL TO CUT-OFF: 0.01 (ref <1.00)

## 2021-02-13 LAB — COMPREHENSIVE METABOLIC PANEL
AG Ratio: 1.6 (calc) (ref 1.0–2.5)
ALT: 15 U/L (ref 9–46)
AST: 14 U/L (ref 10–35)
Albumin: 4.2 g/dL (ref 3.6–5.1)
Alkaline phosphatase (APISO): 39 U/L (ref 35–144)
BUN: 14 mg/dL (ref 7–25)
CO2: 26 mmol/L (ref 20–32)
Calcium: 9.1 mg/dL (ref 8.6–10.3)
Chloride: 103 mmol/L (ref 98–110)
Creat: 0.92 mg/dL (ref 0.70–1.33)
Globulin: 2.6 g/dL (calc) (ref 1.9–3.7)
Glucose, Bld: 95 mg/dL (ref 65–99)
Potassium: 4.4 mmol/L (ref 3.5–5.3)
Sodium: 137 mmol/L (ref 135–146)
Total Bilirubin: 0.6 mg/dL (ref 0.2–1.2)
Total Protein: 6.8 g/dL (ref 6.1–8.1)

## 2021-02-13 LAB — CBC WITH DIFFERENTIAL/PLATELET
Absolute Monocytes: 237 cells/uL (ref 200–950)
Basophils Absolute: 30 cells/uL (ref 0–200)
Basophils Relative: 1 %
Eosinophils Absolute: 159 cells/uL (ref 15–500)
Eosinophils Relative: 5.3 %
HCT: 48 % (ref 38.5–50.0)
Hemoglobin: 15.4 g/dL (ref 13.2–17.1)
Lymphs Abs: 1203 cells/uL (ref 850–3900)
MCH: 28.7 pg (ref 27.0–33.0)
MCHC: 32.1 g/dL (ref 32.0–36.0)
MCV: 89.4 fL (ref 80.0–100.0)
MPV: 9.4 fL (ref 7.5–12.5)
Monocytes Relative: 7.9 %
Neutro Abs: 1371 cells/uL — ABNORMAL LOW (ref 1500–7800)
Neutrophils Relative %: 45.7 %
Platelets: 234 10*3/uL (ref 140–400)
RBC: 5.37 10*6/uL (ref 4.20–5.80)
RDW: 12.4 % (ref 11.0–15.0)
Total Lymphocyte: 40.1 %
WBC: 3 10*3/uL — ABNORMAL LOW (ref 3.8–10.8)

## 2021-02-13 LAB — TSH: TSH: 1 mIU/L (ref 0.40–4.50)

## 2021-02-13 LAB — LIPID PANEL
Cholesterol: 301 mg/dL — ABNORMAL HIGH (ref <200)
HDL: 53 mg/dL (ref >=40)
LDL Cholesterol (Calc): 219 mg/dL (calc) — ABNORMAL HIGH
Non-HDL Cholesterol (Calc): 248 mg/dL (calc) — ABNORMAL HIGH (ref <130)
Total CHOL/HDL Ratio: 5.7 (calc) — ABNORMAL HIGH (ref <5.0)
Triglycerides: 141 mg/dL (ref <150)

## 2021-02-13 MED ORDER — ATORVASTATIN CALCIUM 20 MG PO TABS: 20.0000 mg | ORAL_TABLET | Freq: Every day | ORAL | 2 refills | Status: DC

## 2021-02-13 NOTE — Telephone Encounter (Signed)
-----   Message from Jeoffrey Massed, MD sent at 02/13/2021  8:11 AM EDT ----- All labs normal except LDL ("bad cholesterol") significantly elevated, same as in the past. I recommend pt start cholesterol-lowering therapy in order to try to decrease their risk of developing cardiovascular disease or stroke.  If pt agreeable, pls eRx atorvastatin 20mg , 1 tab po qd, #30, RF x 2. Recheck FLP--lab visit only--in 2-3 months, dx is pure hypercholesterolemia.--thx

## 2021-02-13 NOTE — Telephone Encounter (Signed)
Spoke with pt regarding labs and instructions. Pt will CB to sched FLP appt

## 2021-02-24 DIAGNOSIS — Z1211 Encounter for screening for malignant neoplasm of colon: Secondary | ICD-10-CM

## 2021-02-24 HISTORY — DX: Encounter for screening for malignant neoplasm of colon: Z12.11

## 2021-02-24 LAB — COLOGUARD: Cologuard: NEGATIVE

## 2021-03-02 LAB — COLOGUARD
COLOGUARD: NEGATIVE
Cologuard: NEGATIVE

## 2021-03-10 NOTE — Progress Notes (Unsigned)
02/24/21.

## 2021-03-14 ENCOUNTER — Telehealth: Payer: Self-pay | Admitting: Family Medicine

## 2021-03-14 ENCOUNTER — Encounter: Payer: Self-pay | Admitting: Family Medicine

## 2021-03-14 NOTE — Telephone Encounter (Signed)
LVM for pt to CB regarding results.  

## 2021-03-14 NOTE — Telephone Encounter (Signed)
Pls notify pt that his cologuard screening test came back negative---this is good.  Plan repeat test in 3 years.-thx

## 2021-03-15 NOTE — Telephone Encounter (Signed)
LVM for pt to CB regarding results.  

## 2021-03-16 NOTE — Telephone Encounter (Signed)
LVM for pt to CB regarding results.  

## 2021-03-16 NOTE — Telephone Encounter (Signed)
Letter sent via my chart

## 2023-03-01 ENCOUNTER — Ambulatory Visit: Payer: BC Managed Care – PPO | Admitting: Family Medicine

## 2023-03-01 ENCOUNTER — Encounter: Payer: Self-pay | Admitting: Family Medicine

## 2023-03-01 VITALS — BP 137/69 | HR 69 | Temp 98.3°F | Ht 67.0 in | Wt 152.6 lb

## 2023-03-01 DIAGNOSIS — R2231 Localized swelling, mass and lump, right upper limb: Secondary | ICD-10-CM

## 2023-03-01 DIAGNOSIS — B078 Other viral warts: Secondary | ICD-10-CM

## 2023-03-01 DIAGNOSIS — L821 Other seborrheic keratosis: Secondary | ICD-10-CM | POA: Diagnosis not present

## 2023-03-01 NOTE — Progress Notes (Signed)
OFFICE VISIT  03/01/2023  CC:  Chief Complaint  Patient presents with   Arm Concern    Pt has bump on right forearm,(1 month), left arm, and left lower leg; tried using alcohol on right forearm.     Patient is a 62 y.o. male who presents for bump on right arm and left leg.  HPI: 1 mo growing R forearm papule, started as point pinkness/redness around hair follicle. Now pink,a bit flaky on top. No pain.  Occ itching. L  lower leg light brown macule with a little bit of recent growth. Feeling well.   Past Medical History:  Diagnosis Date   Colon cancer screening 02/24/2021   cologuard neg 02/2021; rpt approx 02/2024.   GERD (gastroesophageal reflux disease)    Hyperlipidemia    Rx'd atorva 09/2016 but pt did not take.  He did some TLC but lipids not changed 3 mo later.  He declined statin again as of 12/12/16.  Chol unchanged 05/2017 and I recommended statin again.   PUD (peptic ulcer disease)    Pt reports hx of H pylori x 2.    Past Surgical History:  Procedure Laterality Date   ESOPHAGOGASTRODUODENOSCOPY  2000/2001   PUD/upper GI bleed.  Sounds like he was treated for H pylori at least once   HEMORRHOID SURGERY  03/2010    Outpatient Medications Prior to Visit  Medication Sig Dispense Refill   atorvastatin (LIPITOR) 20 MG tablet Take 1 tablet (20 mg total) by mouth daily. (Patient not taking: Reported on 03/01/2023) 30 tablet 2   No facility-administered medications prior to visit.    No Known Allergies  Review of Systems  As per HPI  PE:    03/01/2023    9:40 AM 02/09/2021    1:04 PM 01/29/2021    7:25 PM  Vitals with BMI  Height 5\' 7"  5\' 7"    Weight 152 lbs 10 oz 150 lbs 3 oz   BMI 23.89 23.52   Systolic 137 110 782  Diastolic 69 60 94  Pulse 69 67 62     Physical Exam  Gen: Alert, well appearing.  Patient is oriented to person, place, time, and situation. R forearm volar aspect with 6 mm dome shaped pink nodule with superficial flaking of surface. No  ulceration.   Left lower leg with irreg shaped light brown flat papule about 6-8 mm in greatest diameter, surface irreg c/w seb kerat  LABS:  Last CBC Lab Results  Component Value Date   WBC 3.0 (L) 02/10/2021   HGB 15.4 02/10/2021   HCT 48.0 02/10/2021   MCV 89.4 02/10/2021   MCH 28.7 02/10/2021   RDW 12.4 02/10/2021   PLT 234 02/10/2021   Last metabolic panel Lab Results  Component Value Date   GLUCOSE 95 02/10/2021   NA 137 02/10/2021   K 4.4 02/10/2021   CL 103 02/10/2021   CO2 26 02/10/2021   BUN 14 02/10/2021   CREATININE 0.92 02/10/2021   CALCIUM 9.1 02/10/2021   PROT 6.8 02/10/2021   ALBUMIN 4.3 08/24/2016   BILITOT 0.6 02/10/2021   ALKPHOS 37 (L) 08/24/2016   AST 14 02/10/2021   ALT 15 02/10/2021   IMPRESSION AND PLAN:  1) Right arm nodule, pyogenic granuloma?  BCC?  SCC? Shave excision today and sent to derm path. Will refer to derm if approp depending on path result.  Procedure: skin lesion removal.  Consent obtained.  Area prepped and draped after being infiltrated with 1ml of 1% lido  with epi.  Sterile technique utilized using dermablade for shave excision technique.   No immediate complications.  Pt tolerated procedure well. Specimen sent to pathology.  Wound care instructions discussed.  2) L leg seborrheic keratosis->reassured.  An After Visit Summary was printed and given to the patient.  FOLLOW UP: No follow-ups on file.  Signed:  Santiago Bumpers, MD           03/01/2023

## 2023-03-08 ENCOUNTER — Telehealth: Payer: Self-pay | Admitting: Family Medicine

## 2023-03-08 NOTE — Progress Notes (Signed)
Left pt VM to return call.

## 2023-03-08 NOTE — Telephone Encounter (Signed)
Patient understood results of skin lesion was a common wart. No further questions or concerns at this time.

## 2023-03-08 NOTE — Telephone Encounter (Signed)
Noted. Result note updated.

## 2023-03-15 NOTE — Patient Instructions (Signed)

## 2023-03-18 ENCOUNTER — Encounter: Payer: Self-pay | Admitting: Family Medicine

## 2023-03-18 ENCOUNTER — Ambulatory Visit (INDEPENDENT_AMBULATORY_CARE_PROVIDER_SITE_OTHER): Payer: BC Managed Care – PPO | Admitting: Family Medicine

## 2023-03-18 VITALS — BP 111/67 | HR 81 | Temp 98.2°F | Ht 68.0 in | Wt 150.0 lb

## 2023-03-18 DIAGNOSIS — E7800 Pure hypercholesterolemia, unspecified: Secondary | ICD-10-CM

## 2023-03-18 DIAGNOSIS — Z23 Encounter for immunization: Secondary | ICD-10-CM

## 2023-03-18 DIAGNOSIS — Z0000 Encounter for general adult medical examination without abnormal findings: Secondary | ICD-10-CM

## 2023-03-18 DIAGNOSIS — Z125 Encounter for screening for malignant neoplasm of prostate: Secondary | ICD-10-CM | POA: Diagnosis not present

## 2023-03-18 LAB — CBC
HCT: 48.2 % (ref 39.0–52.0)
Hemoglobin: 15.6 g/dL (ref 13.0–17.0)
MCHC: 32.4 g/dL (ref 30.0–36.0)
MCV: 89.2 fl (ref 78.0–100.0)
Platelets: 260 10*3/uL (ref 150.0–400.0)
RBC: 5.41 Mil/uL (ref 4.22–5.81)
RDW: 13.2 % (ref 11.5–15.5)
WBC: 3.1 10*3/uL — ABNORMAL LOW (ref 4.0–10.5)

## 2023-03-18 LAB — COMPREHENSIVE METABOLIC PANEL
ALT: 13 U/L (ref 0–53)
AST: 13 U/L (ref 0–37)
Albumin: 4.6 g/dL (ref 3.5–5.2)
Alkaline Phosphatase: 37 U/L — ABNORMAL LOW (ref 39–117)
BUN: 19 mg/dL (ref 6–23)
CO2: 27 mEq/L (ref 19–32)
Calcium: 9.8 mg/dL (ref 8.4–10.5)
Chloride: 101 mEq/L (ref 96–112)
Creatinine, Ser: 0.9 mg/dL (ref 0.40–1.50)
GFR: 91.82 mL/min (ref >60.00)
Glucose, Bld: 97 mg/dL (ref 70–99)
Potassium: 4.2 mEq/L (ref 3.5–5.1)
Sodium: 137 mEq/L (ref 135–145)
Total Bilirubin: 0.8 mg/dL (ref 0.2–1.2)
Total Protein: 7.4 g/dL (ref 6.0–8.3)

## 2023-03-18 LAB — LIPID PANEL
Cholesterol: 384 mg/dL — ABNORMAL HIGH (ref 0–200)
HDL: 56.5 mg/dL (ref >39.00)
LDL Cholesterol: 291 mg/dL — ABNORMAL HIGH (ref 0–99)
NonHDL: 327.91
Total CHOL/HDL Ratio: 7
Triglycerides: 187 mg/dL — ABNORMAL HIGH (ref 0.0–149.0)
VLDL: 37.4 mg/dL (ref 0.0–40.0)

## 2023-03-18 LAB — PSA: PSA: 1.18 ng/mL (ref 0.10–4.00)

## 2023-03-18 NOTE — Progress Notes (Signed)
Office Note 03/18/2023  CC:  Chief Complaint  Patient presents with   Annual Exam    Pt is fasting   Patient is a 62 y.o. male who is here for annual health maintenance exam and hypercholesterolemia. A/P as of last visit 02/09/21: "Health maintenance exam: Reviewed age and gender appropriate health maintenance issues (prudent diet, regular exercise, health risks of tobacco and excessive alcohol, use of seatbelts, fire alarms in home, use of sunscreen).  Also reviewed age and gender appropriate health screening as well as vaccine recommendations. Vaccines: UTD. Labs: return for fasting HP + PSA and Hep C screening. Prostate ca screening: PSA--future. Colon ca screening: He is "average" risk.  Cologuard neg 2017.  Repeat cologuard ordered today."  INTERIM HX: Feeling well. Eats pretty healthy, active.  May 2022 I recommended statin again.  He states he picked up the medicine at the pharmacy but had second thoughts.  He is apprehensive about being on a medication in the rest of his life as well as apprehensive about potential side effects of statin.   Past Medical History:  Diagnosis Date   Colon cancer screening 02/24/2021   cologuard neg 02/2021; rpt approx 02/2024.   GERD (gastroesophageal reflux disease)    Hyperlipidemia    Rx'd atorva 09/2016 but pt did not take.  He did some TLC but lipids not changed 3 mo later.  He declined statin again as of 12/12/16.  Chol unchanged 05/2017 and I recommended statin again.   PUD (peptic ulcer disease)    Pt reports hx of H pylori x 2.    Past Surgical History:  Procedure Laterality Date   ESOPHAGOGASTRODUODENOSCOPY  2000/2001   PUD/upper GI bleed.  Sounds like he was treated for H pylori at least once   HEMORRHOID SURGERY  03/2010    History reviewed. No pertinent family history.  Social History   Socioeconomic History   Marital status: Married    Spouse name: Not on file   Number of children: Not on file   Years of education:  Not on file   Highest education level: Not on file  Occupational History   Not on file  Tobacco Use   Smoking status: Never   Smokeless tobacco: Never  Substance and Sexual Activity   Alcohol use: Not Currently    Alcohol/week: 0.0 standard drinks of alcohol   Drug use: Never   Sexual activity: Not on file  Other Topics Concern   Not on file  Social History Narrative   Married, one son and one daughter.   Orig from [place], has been in [place] almost 20 yrs.   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   No tobacco.  Occ alcohol.  No drugs.   Social Determinants of Health   Financial Resource Strain: Not on file  Food Insecurity: Not on file  Transportation Needs: Not on file  Physical Activity: Not on file  Stress: Not on file  Social Connections: Not on file  Intimate Partner Violence: Not on file    Outpatient Medications Prior to Visit  Medication Sig Dispense Refill   atorvastatin (LIPITOR) 20 MG tablet Take 1 tablet (20 mg total) by mouth daily. (Patient not taking: Reported on 03/01/2023) 30 tablet 2   No facility-administered medications prior to visit.    No Known Allergies  Review of Systems  Constitutional:  Negative for appetite change, chills, fatigue and fever.  HENT:  Negative for congestion, dental problem, ear pain  and sore throat.   Eyes:  Negative for discharge, redness and visual disturbance.  Respiratory:  Negative for cough, chest tightness, shortness of breath and wheezing.   Cardiovascular:  Negative for chest pain, palpitations and leg swelling.  Gastrointestinal:  Negative for abdominal pain, blood in stool, diarrhea, nausea and vomiting.  Genitourinary:  Negative for difficulty urinating, dysuria, flank pain, frequency, hematuria and urgency.  Musculoskeletal:  Negative for arthralgias, back pain, joint swelling, myalgias and neck stiffness.  Skin:  Negative for pallor and rash.  Neurological:  Negative for dizziness, speech  difficulty, weakness and headaches.  Hematological:  Negative for adenopathy. Does not bruise/bleed easily.  Psychiatric/Behavioral:  Negative for confusion and sleep disturbance. The patient is not nervous/anxious.     PE;    03/18/2023    9:05 AM 03/01/2023    9:40 AM 02/09/2021    1:04 PM  Vitals with BMI  Height 5\' 8"  5\' 7"  5\' 7"   Weight 150 lbs 152 lbs 10 oz 150 lbs 3 oz  BMI 22.81 23.89 23.52  Systolic 111 137 409  Diastolic 67 69 60  Pulse 81 69 67    Gen: Alert, well appearing.  Patient is oriented to person, place, time, and situation. AFFECT: pleasant, lucid thought and speech. ENT: Ears: EACs clear, normal epithelium.  TMs with good light reflex and landmarks bilaterally.  Eyes: no injection, icteris, swelling, or exudate.  EOMI, PERRLA. Nose: no drainage or turbinate edema/swelling.  No injection or focal lesion.  Mouth: lips without lesion/swelling.  Oral mucosa pink and moist.  Dentition intact and without obvious caries or gingival swelling.  Oropharynx without erythema, exudate, or swelling.  Neck: supple/nontender.  No LAD, mass, or TM.  Carotid pulses 2+ bilaterally, without bruits. CV: RRR, no m/r/g.   LUNGS: CTA bilat, nonlabored resps, good aeration in all lung fields. ABD: soft, NT, ND, BS normal.  No hepatospenomegaly or mass.  No bruits. EXT: no clubbing, cyanosis, or edema.  Musculoskeletal: no joint swelling, erythema, warmth, or tenderness.  ROM of all joints intact. Skin - no sores or suspicious lesions or rashes or color changes  Pertinent labs:  Lab Results  Component Value Date   TSH 1.00 02/10/2021   Lab Results  Component Value Date   WBC 3.0 (L) 02/10/2021   HGB 15.4 02/10/2021   HCT 48.0 02/10/2021   MCV 89.4 02/10/2021   PLT 234 02/10/2021   Lab Results  Component Value Date   CREATININE 0.92 02/10/2021   BUN 14 02/10/2021   NA 137 02/10/2021   K 4.4 02/10/2021   CL 103 02/10/2021   CO2 26 02/10/2021   Lab Results  Component Value  Date   ALT 15 02/10/2021   AST 14 02/10/2021   ALKPHOS 37 (L) 08/24/2016   BILITOT 0.6 02/10/2021   Lab Results  Component Value Date   CHOL 301 (H) 02/10/2021   Lab Results  Component Value Date   HDL 53 02/10/2021   Lab Results  Component Value Date   LDLCALC 219 (H) 02/10/2021   Lab Results  Component Value Date   TRIG 141 02/10/2021   Lab Results  Component Value Date   CHOLHDL 5.7 (H) 02/10/2021   Lab Results  Component Value Date   PSA 0.90 08/24/2016   PSA 2.19 09/06/2015   ASSESSMENT AND PLAN:   #1 health maintenance exam: Reviewed age and gender appropriate health maintenance issues (prudent diet, regular exercise, health risks of tobacco and excessive alcohol, use of seatbelts,  fire alarms in home, use of sunscreen).  Also reviewed age and gender appropriate health screening as well as vaccine recommendations. Vaccines: Shingrix --declined.  Otherwise UTD. Labs: HP + PSA. Prostate ca screening: PSA. Colon ca screening: He is "average" risk.  Cologuard neg 2022, plan rpt approx 02/2024.   #2 hypercholesterolemia. His LDL cholesterol has been 253-260, most recently May 2022.Marland Kitchen HDL have been in the 50s. Triglycerides have been normal. Statin recommended multiple times but he has been apprehensive.  He is open to trial of this medication now.  Repeat lipid panel today and then send statin to pharmacy.  An After Visit Summary was printed and given to the patient.  FOLLOW UP:  No follow-ups on file.  Signed:  Santiago Bumpers, MD           03/18/2023

## 2023-03-19 ENCOUNTER — Other Ambulatory Visit: Payer: Self-pay

## 2023-03-19 DIAGNOSIS — E7800 Pure hypercholesterolemia, unspecified: Secondary | ICD-10-CM

## 2023-03-19 MED ORDER — ATORVASTATIN CALCIUM 20 MG PO TABS: 20.0000 mg | ORAL_TABLET | Freq: Every day | ORAL | 2 refills | Status: DC

## 2023-05-09 ENCOUNTER — Encounter (INDEPENDENT_AMBULATORY_CARE_PROVIDER_SITE_OTHER): Payer: Self-pay

## 2023-06-19 ENCOUNTER — Ambulatory Visit: Payer: BC Managed Care – PPO | Admitting: Family Medicine

## 2023-08-19 ENCOUNTER — Encounter: Payer: Self-pay | Admitting: Family Medicine

## 2023-08-19 ENCOUNTER — Ambulatory Visit: Payer: BC Managed Care – PPO | Admitting: Family Medicine

## 2023-08-19 VITALS — BP 123/76 | HR 63 | Wt 149.6 lb

## 2023-08-19 DIAGNOSIS — E7800 Pure hypercholesterolemia, unspecified: Secondary | ICD-10-CM | POA: Diagnosis not present

## 2023-08-19 LAB — LIPID PANEL
Cholesterol: 354 mg/dL — ABNORMAL HIGH (ref 0–200)
HDL: 59.9 mg/dL (ref >39.00)
LDL Cholesterol: 278 mg/dL — ABNORMAL HIGH (ref 0–99)
NonHDL: 294.19
Total CHOL/HDL Ratio: 6
Triglycerides: 82 mg/dL (ref 0.0–149.0)
VLDL: 16.4 mg/dL (ref 0.0–40.0)

## 2023-08-19 NOTE — Progress Notes (Signed)
OFFICE VISIT  08/19/2023  CC:  Chief Complaint  Patient presents with   Medical Management of Chronic Issues    Pt is fasting. Pt has not taken med this morning.     Patient is a 62 y.o. male who presents for 40-month follow-up hypercholesterolemia A/P as of last visit: "#2 hypercholesterolemia. His LDL cholesterol has been 253-260, most recently May 2022. HDL have been in the 50s. Triglycerides have been normal. Statin recommended multiple times but he has been apprehensive.  He is open to trial of this medication now.  Repeat lipid panel today and then send statin to pharmacy."  INTERIM HX: Hue is still not comfortable with taking the medication.  He decided to not start it. He does not like the idea of taking medication every day. He is also little apprehensive on what side effects he may have from it.  Past Medical History:  Diagnosis Date   Colon cancer screening 02/24/2021   cologuard neg 02/2021; rpt approx 02/2024.   GERD (gastroesophageal reflux disease)    Hyperlipidemia    Rx'd atorva 09/2016 but pt did not take.  He did some TLC but lipids not changed 3 mo later.  He declined statin again as of 12/12/16.  Chol unchanged 05/2017 and I recommended statin again.   PUD (peptic ulcer disease)    Pt reports hx of H pylori x 2.    Past Surgical History:  Procedure Laterality Date   ESOPHAGOGASTRODUODENOSCOPY  2000/2001   PUD/upper GI bleed.  Sounds like he was treated for H pylori at least once   HEMORRHOID SURGERY  03/2010    Outpatient Medications Prior to Visit  Medication Sig Dispense Refill   atorvastatin (LIPITOR) 20 MG tablet Take 1 tablet (20 mg total) by mouth daily. 30 tablet 2   No facility-administered medications prior to visit.    No Known Allergies  Review of Systems As per HPI  PE:    08/19/2023    8:33 AM 03/18/2023    9:05 AM 03/01/2023    9:40 AM  Vitals with BMI  Height  5\' 8"  5\' 7"   Weight 149 lbs 10 oz 150 lbs 152 lbs 10 oz  BMI   22.81 23.89  Systolic 123 111 409  Diastolic 76 67 69  Pulse 63 81 69     Physical Exam  Gen: Alert, well appearing.  Patient is oriented to person, place, time, and situation. No further exam today  LABS:  Last metabolic panel Lab Results  Component Value Date   GLUCOSE 97 03/18/2023   NA 137 03/18/2023   K 4.2 03/18/2023   CL 101 03/18/2023   CO2 27 03/18/2023   BUN 19 03/18/2023   CREATININE 0.90 03/18/2023   GFR 91.82 03/18/2023   CALCIUM 9.8 03/18/2023   PROT 7.4 03/18/2023   ALBUMIN 4.6 03/18/2023   BILITOT 0.8 03/18/2023   ALKPHOS 37 (L) 03/18/2023   AST 13 03/18/2023   ALT 13 03/18/2023   Last lipids Lab Results  Component Value Date   CHOL 384 (H) 03/18/2023   HDL 56.50 03/18/2023   LDLCALC 291 (H) 03/18/2023   LDLDIRECT 223.0 09/06/2015   TRIG 187.0 (H) 03/18/2023   CHOLHDL 7 03/18/2023    IMPRESSION AND PLAN:  Mixed hyperlipidemia. Recommended statin multiple times over the last few years. He is apprehensive about this and so far has not excepted trial of medication. Will check level again today per his wishes and he will continue to work on diet  and exercise-->no meds. We did discuss the potential risks and benefits of statin medication today and he expressed understanding.  An After Visit Summary was printed and given to the patient.  FOLLOW UP: Return in about 7 months (around 03/18/2024) for annual CPE (fasting).  Signed:  Santiago Bumpers, MD           08/19/2023

## 2024-03-18 ENCOUNTER — Encounter: Payer: BC Managed Care – PPO | Admitting: Family Medicine

## 2024-04-01 ENCOUNTER — Encounter: Payer: Self-pay | Admitting: Family Medicine

## 2024-04-01 ENCOUNTER — Ambulatory Visit (INDEPENDENT_AMBULATORY_CARE_PROVIDER_SITE_OTHER): Admitting: Family Medicine

## 2024-04-01 VITALS — BP 102/60 | HR 62 | Temp 96.8°F | Ht 68.0 in | Wt 152.2 lb

## 2024-04-01 DIAGNOSIS — Z125 Encounter for screening for malignant neoplasm of prostate: Secondary | ICD-10-CM

## 2024-04-01 DIAGNOSIS — Z0000 Encounter for general adult medical examination without abnormal findings: Secondary | ICD-10-CM

## 2024-04-01 DIAGNOSIS — Z1211 Encounter for screening for malignant neoplasm of colon: Secondary | ICD-10-CM

## 2024-04-01 DIAGNOSIS — E7800 Pure hypercholesterolemia, unspecified: Secondary | ICD-10-CM

## 2024-04-01 DIAGNOSIS — M25512 Pain in left shoulder: Secondary | ICD-10-CM

## 2024-04-01 NOTE — Progress Notes (Signed)
 Office Note 04/01/2024  CC:  Chief Complaint  Patient presents with   Annual Exam    Pt is fasting   Patient is a 63 y.o. male who is here for annual health maintenance exam and 20-month follow-up hypercholesterolemia. A/P as of last visit: Mixed hyperlipidemia. Recommended statin multiple times over the last few years. He is apprehensive about this and so far has not excepted trial of medication. Will check level again today per his wishes and he will continue to work on diet and exercise-->no meds. We did discuss the potential risks and benefits of statin medication today and he expressed understanding.  INTERIM HX: Doing well but has had some left shoulder pain x 1 wk. Points across anterolateral deltoid region.  Driving a lot recently, turning steering wheel mostly with left arm/shoulder. Getting better gradually the last 3d. Similar pain Dec 2024 but spont resolved.   No neck pain or radiating arm pain.  No paresthesias or weakness. No CP, SOB, nausea, palpitations, diaphoresis, or dizziness.  Past Medical History:  Diagnosis Date   Colon cancer screening 02/24/2021   cologuard neg 02/2021; rpt approx 02/2024.   GERD (gastroesophageal reflux disease)    Hyperlipidemia    Rx'd atorva 09/2016 but pt did not take.  He did some TLC but lipids not changed 3 mo later.  He declined statin again as of 12/12/16.  Chol unchanged 05/2017 and I recommended statin again.   PUD (peptic ulcer disease)    Pt reports hx of H pylori x 2.    Past Surgical History:  Procedure Laterality Date   ESOPHAGOGASTRODUODENOSCOPY  2000/2001   PUD/upper GI bleed.  Sounds like he was treated for H pylori at least once   HEMORRHOID SURGERY  03/2010    History reviewed. No pertinent family history.  Social History   Socioeconomic History   Marital status: Married    Spouse name: Not on file   Number of children: Not on file   Years of education: Not on file   Highest education level: Not on  file  Occupational History   Not on file  Tobacco Use   Smoking status: Never   Smokeless tobacco: Never  Substance and Sexual Activity   Alcohol use: Not Currently    Alcohol/week: 0.0 standard drinks of alcohol   Drug use: Never   Sexual activity: Not on file  Other Topics Concern   Not on file  Social History Narrative   Married, one son and one daughter.   Orig from [place], has been in [place]  almost 20 yrs.   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   No tobacco.  Occ alcohol.  No drugs.   Social Drivers of Corporate investment banker Strain: Not on file  Food Insecurity: Not on file  Transportation Needs: Not on file  Physical Activity: Not on file  Stress: Not on file  Social Connections: Not on file  Intimate Partner Violence: Not on file    No outpatient medications prior to visit.   No facility-administered medications prior to visit.    No Known Allergies  Review of Systems  Constitutional:  Negative for appetite change, chills, fatigue and fever.  HENT:  Negative for congestion, dental problem, ear pain and sore throat.   Eyes:  Negative for discharge, redness and visual disturbance.  Respiratory:  Negative for cough, chest tightness, shortness of breath and wheezing.   Cardiovascular:  Negative for chest pain, palpitations and  leg swelling.  Gastrointestinal:  Negative for abdominal pain, blood in stool, diarrhea, nausea and vomiting.  Genitourinary:  Negative for difficulty urinating, dysuria, flank pain, frequency, hematuria and urgency.  Musculoskeletal:  Positive for arthralgias (left shoulder pain). Negative for back pain, joint swelling, myalgias and neck stiffness.  Skin:  Negative for pallor and rash.  Neurological:  Negative for dizziness, speech difficulty, weakness and headaches.  Hematological:  Negative for adenopathy. Does not bruise/bleed easily.  Psychiatric/Behavioral:  Negative for confusion and sleep disturbance.  The patient is not nervous/anxious.     PE;    04/01/2024    9:49 AM 08/19/2023    8:33 AM 03/18/2023    9:05 AM  Vitals with BMI  Height 5' 8  5' 8  Weight 152 lbs 3 oz 149 lbs 10 oz 150 lbs  BMI 23.15  22.81  Systolic 102 123 888  Diastolic 60 76 67  Pulse 62 63 81   Gen: Alert, well appearing.  Patient is oriented to person, place, time, and situation. AFFECT: pleasant, lucid thought and speech. ENT: Ears: EACs clear, normal epithelium.  TMs with good light reflex and landmarks bilaterally.  Eyes: no injection, icteris, swelling, or exudate.  EOMI, PERRLA. Nose: no drainage or turbinate edema/swelling.  No injection or focal lesion.  Mouth: lips without lesion/swelling.  Oral mucosa pink and moist.  Dentition intact and without obvious caries or gingival swelling.  Oropharynx without erythema, exudate, or swelling.  Neck: supple/nontender.  No LAD, mass, or TM.  Carotid pulses 2+ bilaterally, without bruits. CV: RRR, no m/r/g.   LUNGS: CTA bilat, nonlabored resps, good aeration in all lung fields. ABD: soft, NT, ND, BS normal.  No hepatospenomegaly or mass.  No bruits. EXT: no clubbing, cyanosis, or edema.  Musculoskeletal: no joint swelling, erythema, warmth, or tenderness.  ROM of all joints intact. Skin - no sores or suspicious lesions or rashes or color changes  Pertinent labs:  None today  ASSESSMENT AND PLAN:   #1 health maintenance exam: Reviewed age and gender appropriate health maintenance issues (prudent diet, regular exercise, health risks of tobacco and excessive alcohol, use of seatbelts, fire alarms in home, use of sunscreen).  Also reviewed age and gender appropriate health screening as well as vaccine recommendations. Vaccines: Shingrix --declined.  Otherwise UTD. Labs: HP + PSA. Prostate ca screening: PSA today. Colon ca screening: He is average risk.  Cologuard neg 2022-->rpt cologuard ordered today.   2) Acute L shoulder pain. Suspect overuse  recently. Reassured.  3) Hypercholesterolemia. Pt prefers No statin.  An After Visit Summary was printed and given to the patient.  FOLLOW UP:  1 yr  Signed:  Gerlene Hockey, MD           04/01/2024

## 2024-04-02 ENCOUNTER — Ambulatory Visit: Payer: Self-pay | Admitting: Family Medicine

## 2024-04-02 LAB — CBC WITH DIFFERENTIAL/PLATELET
Absolute Lymphocytes: 1254 {cells}/uL (ref 850–3900)
Absolute Monocytes: 198 {cells}/uL — ABNORMAL LOW (ref 200–950)
Basophils Absolute: 30 {cells}/uL (ref 0–200)
Basophils Relative: 1 %
Eosinophils Absolute: 147 {cells}/uL (ref 15–500)
Eosinophils Relative: 4.9 %
HCT: 44 % (ref 38.5–50.0)
Hemoglobin: 14.2 g/dL (ref 13.2–17.1)
MCH: 28.9 pg (ref 27.0–33.0)
MCHC: 32.3 g/dL (ref 32.0–36.0)
MCV: 89.6 fL (ref 80.0–100.0)
MPV: 10.1 fL (ref 7.5–12.5)
Monocytes Relative: 6.6 %
Neutro Abs: 1371 {cells}/uL — ABNORMAL LOW (ref 1500–7800)
Neutrophils Relative %: 45.7 %
Platelets: 230 Thousand/uL (ref 140–400)
RBC: 4.91 Million/uL (ref 4.20–5.80)
RDW: 12.9 % (ref 11.0–15.0)
Total Lymphocyte: 41.8 %
WBC: 3 Thousand/uL — ABNORMAL LOW (ref 3.8–10.8)

## 2024-04-02 LAB — COMPREHENSIVE METABOLIC PANEL WITH GFR
AG Ratio: 1.7 (calc) (ref 1.0–2.5)
ALT: 12 U/L (ref 9–46)
AST: 14 U/L (ref 10–35)
Albumin: 4.3 g/dL (ref 3.6–5.1)
Alkaline phosphatase (APISO): 34 U/L — ABNORMAL LOW (ref 35–144)
BUN: 16 mg/dL (ref 7–25)
CO2: 26 mmol/L (ref 20–32)
Calcium: 8.8 mg/dL (ref 8.6–10.3)
Chloride: 103 mmol/L (ref 98–110)
Creat: 0.79 mg/dL (ref 0.70–1.35)
Globulin: 2.5 g/dL (ref 1.9–3.7)
Glucose, Bld: 94 mg/dL (ref 65–99)
Potassium: 4.1 mmol/L (ref 3.5–5.3)
Sodium: 138 mmol/L (ref 135–146)
Total Bilirubin: 0.6 mg/dL (ref 0.2–1.2)
Total Protein: 6.8 g/dL (ref 6.1–8.1)
eGFR: 100 mL/min/1.73m2 (ref >=60)

## 2024-04-02 LAB — LIPID PANEL
Cholesterol: 338 mg/dL — ABNORMAL HIGH (ref <200)
HDL: 62 mg/dL (ref >=40)
LDL Cholesterol (Calc): 253 mg/dL — ABNORMAL HIGH
Non-HDL Cholesterol (Calc): 276 mg/dL — ABNORMAL HIGH (ref <130)
Total CHOL/HDL Ratio: 5.5 (calc) — ABNORMAL HIGH (ref <5.0)
Triglycerides: 104 mg/dL (ref <150)

## 2024-04-02 LAB — PSA: PSA: 0.94 ng/mL (ref <=4.00)

## 2024-04-02 LAB — TSH: TSH: 1.15 m[IU]/L (ref 0.40–4.50)

## 2024-09-28 ENCOUNTER — Ambulatory Visit: Admitting: Family Medicine

## 2024-09-28 ENCOUNTER — Encounter: Payer: Self-pay | Admitting: Family Medicine

## 2024-09-28 VITALS — BP 134/71 | HR 64 | Temp 97.3°F | Ht 68.0 in | Wt 153.0 lb

## 2024-09-28 DIAGNOSIS — S80811A Abrasion, right lower leg, initial encounter: Secondary | ICD-10-CM | POA: Diagnosis not present

## 2024-09-28 DIAGNOSIS — W5503XA Scratched by cat, initial encounter: Secondary | ICD-10-CM | POA: Diagnosis not present

## 2024-09-28 NOTE — Progress Notes (Signed)
 OFFICE VISIT  09/28/2024  CC:  Chief Complaint  Patient presents with   Animal Bite    Pt got scratched by daughter's cat on Jan 1; last Tdap 01/29/21   Patient is a 64 y.o. male who presents for a cat scratch.  HPI: 5 days ago his daughter's cat scratched him on the right leg. He applied some topical antibacterial.  The area itched and was a little bit red initially but has since been completely asymptomatic and without any redness or swelling. The Is fully vaccinated. The cat did not bite him. He denies fever, chills, malaise, body aches, headache, or rash.  Past Medical History:  Diagnosis Date   Colon cancer screening 02/24/2021   cologuard neg 02/2021; rpt approx 02/2024.   GERD (gastroesophageal reflux disease)    Hyperlipidemia    Rx'd atorva 09/2016 but pt did not take.  He did some TLC but lipids not changed 3 mo later.  He declined statin again as of 12/12/16.  Chol unchanged 05/2017 and I recommended statin again.   PUD (peptic ulcer disease)    Pt reports hx of H pylori x 2.    Past Surgical History:  Procedure Laterality Date   ESOPHAGOGASTRODUODENOSCOPY  2000/2001   PUD/upper GI bleed.  Sounds like he was treated for H pylori at least once   HEMORRHOID SURGERY  03/2010    No outpatient medications prior to visit.   No facility-administered medications prior to visit.    Allergies[1]  Review of Systems  As per HPI  PE:    09/28/2024    3:58 PM 04/01/2024    9:49 AM 08/19/2023    8:33 AM  Vitals with BMI  Height 5' 8 5' 8   Weight 153 lbs 152 lbs 3 oz 149 lbs 10 oz  BMI 23.27 23.15   Systolic 134 102 876  Diastolic 71 60 76  Pulse 64 62 63     Physical Exam  General: Alert and well-appearing. Right calf with approximately 4 cm partially healed linear abrasion (dry).  No surrounding erythema.  No swelling.  No tenderness or induration.  LABS:  Last CBC Lab Results  Component Value Date   WBC 3.0 (L) 04/01/2024   HGB 14.2 04/01/2024   HCT 44.0  04/01/2024   MCV 89.6 04/01/2024   MCH 28.9 04/01/2024   RDW 12.9 04/01/2024   PLT 230 04/01/2024   Last metabolic panel Lab Results  Component Value Date   GLUCOSE 94 04/01/2024   NA 138 04/01/2024   K 4.1 04/01/2024   CL 103 04/01/2024   CO2 26 04/01/2024   BUN 16 04/01/2024   CREATININE 0.79 04/01/2024   EGFR 100 04/01/2024   CALCIUM  8.8 04/01/2024   PROT 6.8 04/01/2024   ALBUMIN 4.6 03/18/2023   BILITOT 0.6 04/01/2024   ALKPHOS 37 (L) 03/18/2023   AST 14 04/01/2024   ALT 12 04/01/2024   Last lipids Lab Results  Component Value Date   CHOL 338 (H) 04/01/2024   HDL 62 04/01/2024   LDLCALC 253 (H) 04/01/2024   LDLDIRECT 223.0 09/06/2015   TRIG 104 04/01/2024   CHOLHDL 5.5 (H) 04/01/2024   IMPRESSION AND PLAN:  Right lower leg cat scratch. No sign of infection. Reassured.  An After Visit Summary was printed and given to the patient.  FOLLOW UP: No follow-ups on file.  Signed:  Gerlene Hockey, MD           09/28/2024      [1] No  Known Allergies
